# Patient Record
Sex: Female | Born: 1945 | Race: White | Hispanic: No | Smoking: Never smoker
Health system: Southern US, Community
[De-identification: ages and names within clinical notes are randomized; demographics above are authoritative.]

## PROBLEM LIST (undated history)

## (undated) DIAGNOSIS — Z9889 Other specified postprocedural states: Secondary | ICD-10-CM

## (undated) DIAGNOSIS — T8859XA Other complications of anesthesia, initial encounter: Secondary | ICD-10-CM

## (undated) DIAGNOSIS — E039 Hypothyroidism, unspecified: Secondary | ICD-10-CM

## (undated) DIAGNOSIS — M1990 Unspecified osteoarthritis, unspecified site: Secondary | ICD-10-CM

## (undated) DIAGNOSIS — J189 Pneumonia, unspecified organism: Secondary | ICD-10-CM

## (undated) DIAGNOSIS — R112 Nausea with vomiting, unspecified: Secondary | ICD-10-CM

## (undated) DIAGNOSIS — C801 Malignant (primary) neoplasm, unspecified: Secondary | ICD-10-CM

## (undated) DIAGNOSIS — I10 Essential (primary) hypertension: Secondary | ICD-10-CM

## (undated) DIAGNOSIS — R7303 Prediabetes: Secondary | ICD-10-CM

---

## 1998-11-06 ENCOUNTER — Ambulatory Visit (HOSPITAL_COMMUNITY): Admission: RE | Admit: 1998-11-06 | Discharge: 1998-11-06 | Payer: Self-pay

## 1999-10-22 ENCOUNTER — Ambulatory Visit (HOSPITAL_COMMUNITY): Admission: RE | Admit: 1999-10-22 | Discharge: 1999-10-22 | Payer: Self-pay

## 2000-10-26 ENCOUNTER — Encounter: Payer: Self-pay | Admitting: Family Medicine

## 2000-10-26 ENCOUNTER — Ambulatory Visit (HOSPITAL_COMMUNITY): Admission: RE | Admit: 2000-10-26 | Discharge: 2000-10-26 | Payer: Self-pay | Admitting: Family Medicine

## 2001-10-27 ENCOUNTER — Ambulatory Visit (HOSPITAL_COMMUNITY): Admission: RE | Admit: 2001-10-27 | Discharge: 2001-10-27 | Payer: Self-pay | Admitting: Family Medicine

## 2001-10-27 ENCOUNTER — Encounter: Payer: Self-pay | Admitting: Family Medicine

## 2004-09-15 ENCOUNTER — Encounter: Admission: RE | Admit: 2004-09-15 | Discharge: 2004-09-15 | Payer: Self-pay | Admitting: Specialist

## 2004-10-12 ENCOUNTER — Observation Stay (HOSPITAL_COMMUNITY): Admission: RE | Admit: 2004-10-12 | Discharge: 2004-10-13 | Payer: Self-pay | Admitting: Specialist

## 2006-03-12 IMAGING — RF DG MYELOGRAM LUMBAR
14 of 19 series · 14 of 19 positions shown · IV contrast (omnipaque)
Comparison: none

CLINICAL DATA: Patient has progressive back pain, progressive left lower extremity radicular pain, numbness and tingling.  
 LUMBAR MYELOGRAM 
 Following informed consent, a 22 gauge needle is placed into the thecal sac centrally at the L-3 level.  15 cc Omnipaque 180 contrast was injected.  The needle was withdrawn and multiple images were obtained.
 The patient has five non-rib bearing lumbar-type vertebrae.  Lateral bending views as well as flexion and extension views were obtained.  There is an extradural defect on the left at the L-3 level.  This is most pronounced in the AP and neutral position.  No appreciable change with lateral bending.  In the sagittal projection, there is grade I anterolisthesis of L-3 on L-4.  This measures 4 mm in the neutral position, approximately 3 mm with extension and approximately 4.5 with flexion.  The oblique view shows truncation of the left L-3 root sleeve.  The left L-4 and left L-5 roots fill normally.  On the right, there is underfilling of the right L-3 root.  The L4-5 and S-1 roots fill normally. 
 IMPRESSION
 Extradural defect seen on the left at the L-3 level.  CT to follow.
 CT OF THE LUMBAR SPINE POST-INTRATHECAL CONTRAST  
 L1-2 level is normal. 
 L2-3:  Disc bulge eccentric left.  There is soft tissue in the left subarticular lateral recess which I suspect is disc material.  This results in mild indentation upon the thecal sac.  The L-2 roots exit without encroachment.  
 L3-4:  There is a left paracentral disc herniation.  Disc extends cephalad resulting in mild indentation upon the left lateral thecal sac.  This disc material abuts the L-3 dorsal root ganglion and I suspect encroaches upon the extraforaminal root.  Associated facet degenerative changes are seen bilaterally.  
 L4-5:  Facet degenerative changes are seen bilaterally.  The L-4 roots exit without encroachment. 
 L5-S1:  Disc height loss with central vacuum disc.  The patient has bilateral pars interarticularis defects.  No appreciable central nor foraminal stenosis. 
 L3-4 disc herniation.  There is cephalad extent of disc material which results in indentation upon the left lateral aspect of the thecal sac and extends into the left L2-3 subarticular lateral recess.  Disc material also extends into the left foramen and I suspect encroaches upon the extraforaminal L-3 root.  Facet degenerative changes are seen at L3-4, L4-5, and at L5-S1.  
 Bilateral pars interarticularis defects are seen at L5-S1.
 MULTIPLANAR REFORMAT IMAGING OF THE LUMBAR SPINE 
 L3-4 disc extrusion is noted with disc material migrated cephalad, lying posterior to the L-3 vertebral body.  This is eccentric left.  Additionally, there is left foraminal disc material at the L3-4 level.  Accelerated degenerative disc disease is seen at L5-S1.  No appreciable foraminal encroachment. 
 See above report.

[Series 1: (hospital) · 1 of 1 slices shown]
[im 1/1]
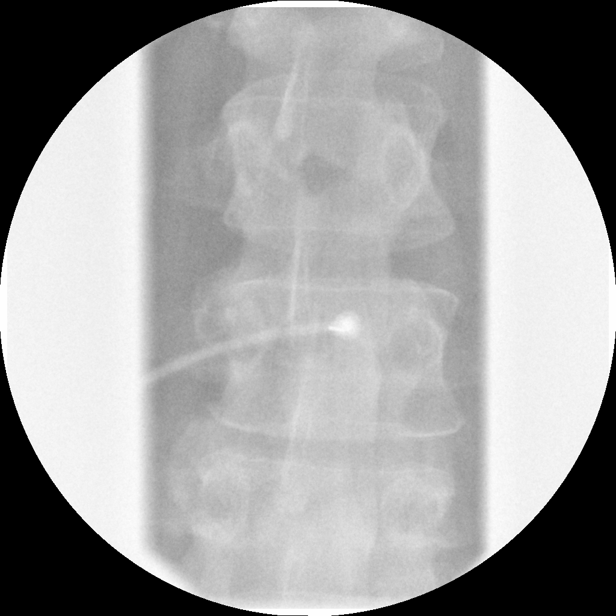

[Series 3: myelogram  white · 1 of 1 slices shown (1 of 9)]
[im 1/1]
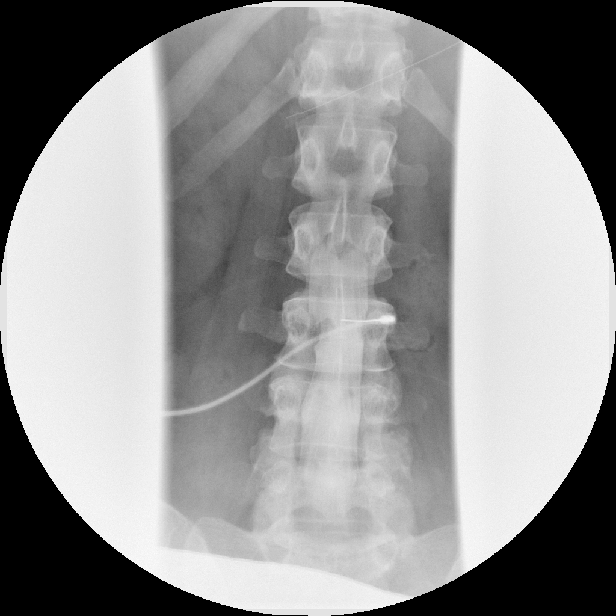

[Series 4: myelogram  white · 1 of 1 slices shown (2 of 9)]
[im 1/1]
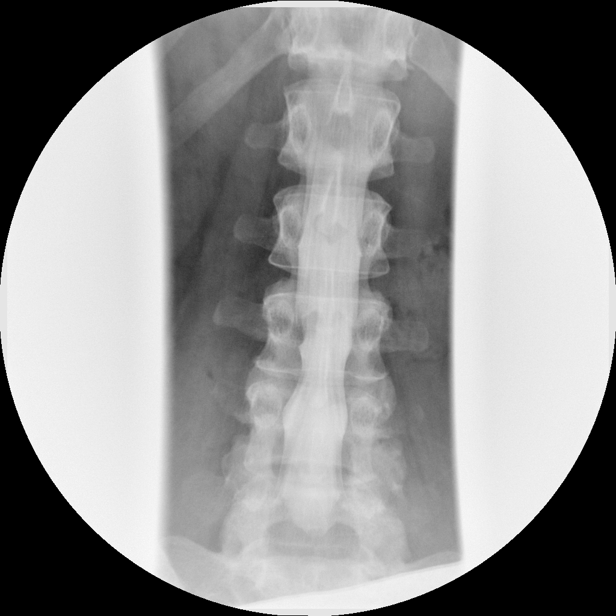

[Series 5: myelogram  white · 1 of 1 slices shown (3 of 9)]
[im 1/1]
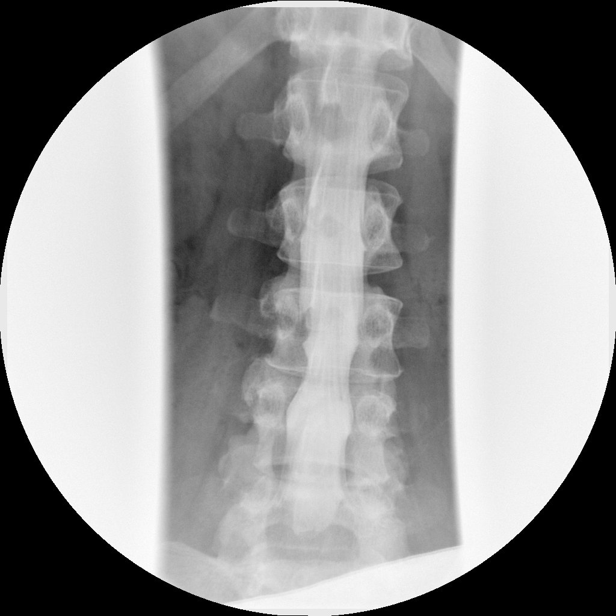

[Series 7: myelogram  white · 1 of 1 slices shown (4 of 9)]
[im 1/1]
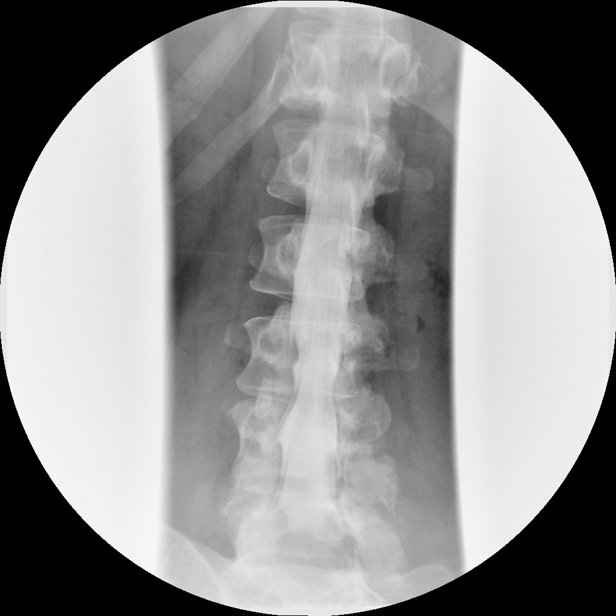

[Series 8: myelogram  white · 1 of 1 slices shown (5 of 9)]
[im 1/1]
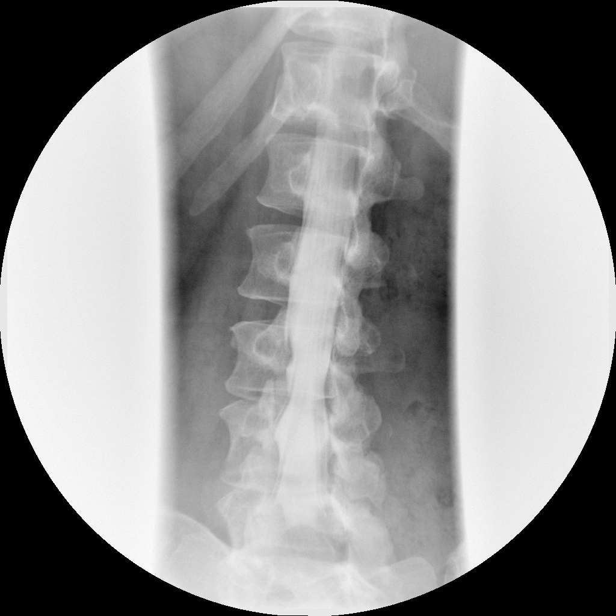

[Series 9: myelogram  white · 1 of 1 slices shown (6 of 9)]
[im 1/1]
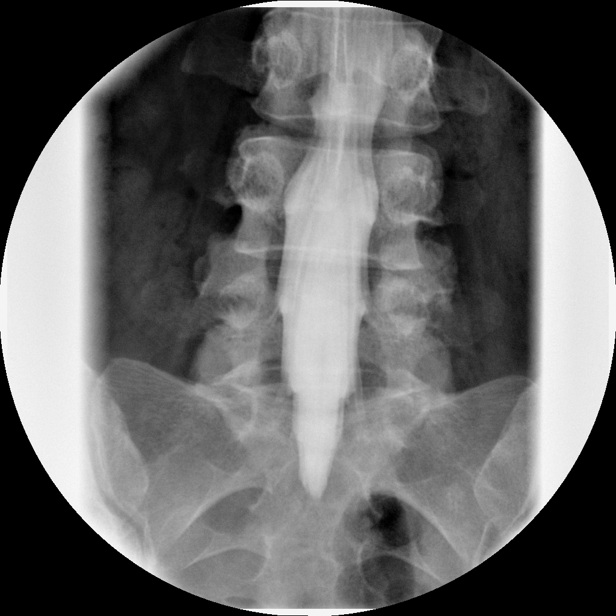

[Series 11: myelogram  white · 1 of 1 slices shown (7 of 9)]
[im 1/1]
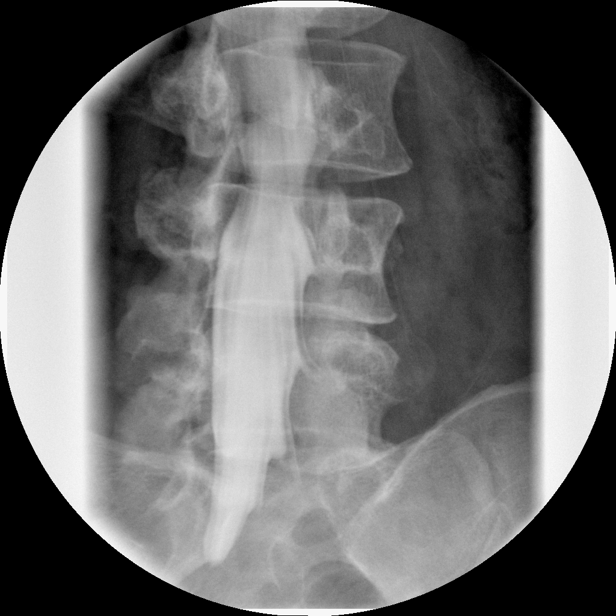

[Series 12: myelogram  white · 1 of 1 slices shown (8 of 9)]
[im 1/1]
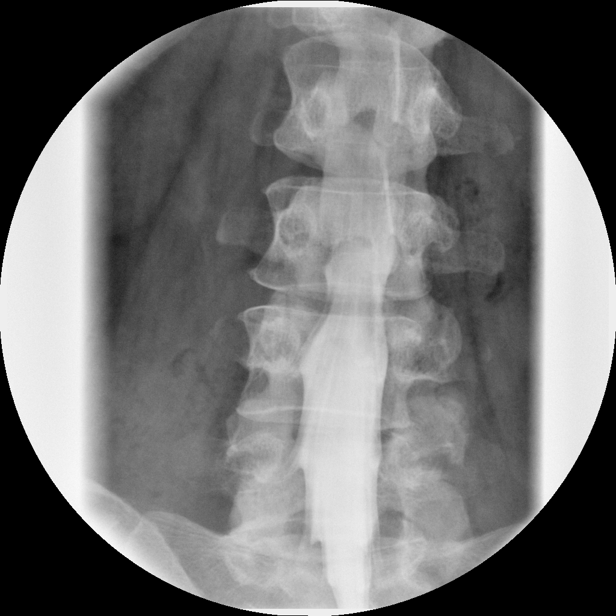

[Series 13: myelogram  white · 1 of 1 slices shown (9 of 9)]
[im 1/1]
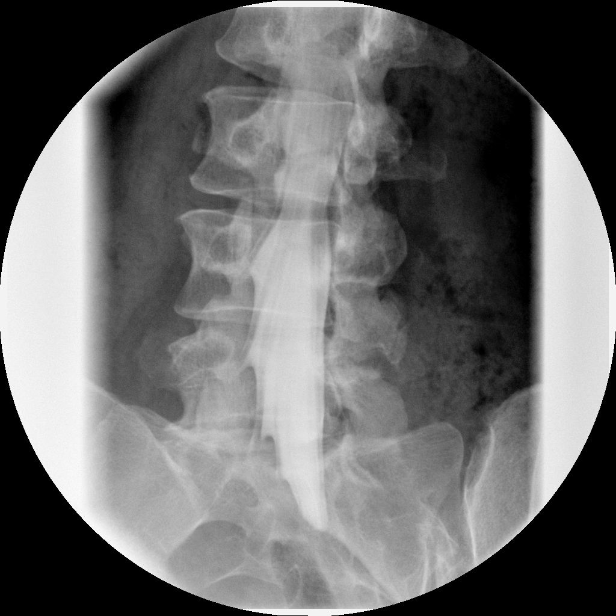

[Series 1001: view not recorded · 0.20mm/px · 1 of 1 slices shown (1 of 4)]
[im 1/1]
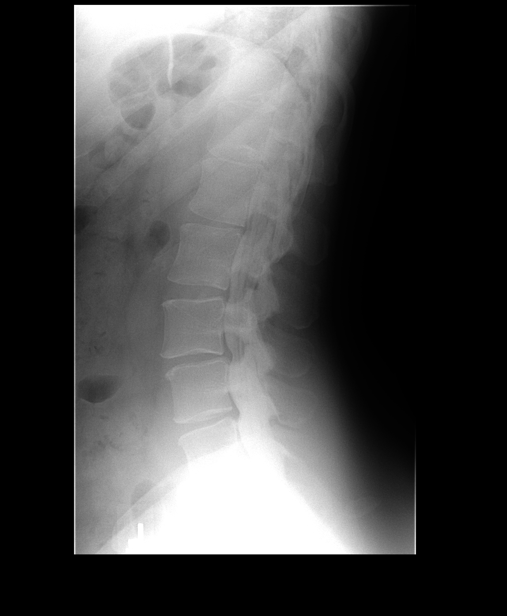

[Series 1002: view not recorded · 0.20mm/px · 1 of 1 slices shown (2 of 4)]
[im 1/1]
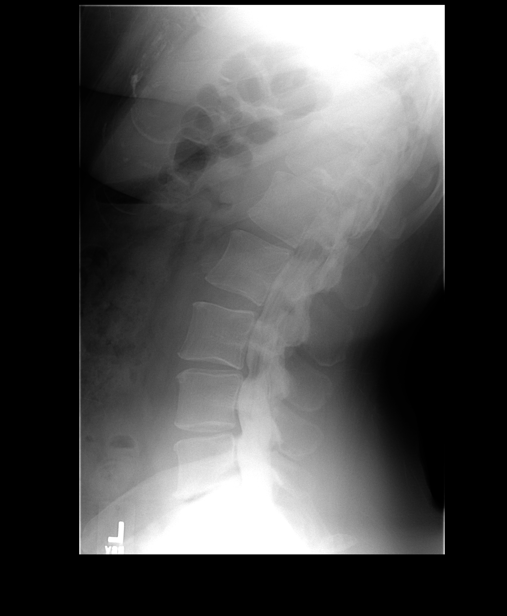

[Series 1003: view not recorded · 0.20mm/px · 1 of 1 slices shown (3 of 4)]
[im 1/1]
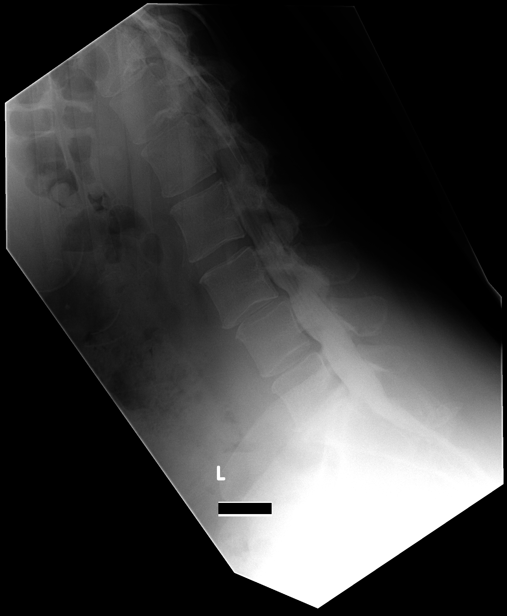

[Series 1005: view not recorded · 0.20mm/px · 1 of 1 slices shown (4 of 4)]
[im 1/1]
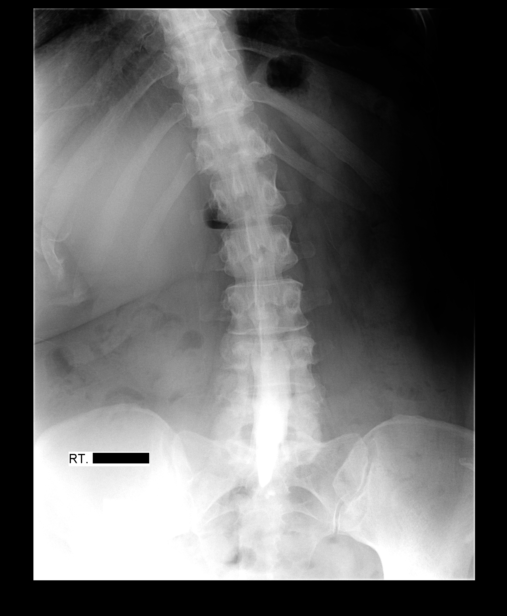

[14 of 19 positions shown; findings below may reference images not displayed]

## 2006-03-12 IMAGING — CT CT RECONSTRUCTION
3 of 10 series · 11 of 33 positions shown, 13 images · IV contrast (omnipaque)
Comparison: none

CLINICAL DATA: Patient has progressive back pain, progressive left lower extremity radicular pain, numbness and tingling.  
 LUMBAR MYELOGRAM 
 Following informed consent, a 22 gauge needle is placed into the thecal sac centrally at the L-3 level.  15 cc Omnipaque 180 contrast was injected.  The needle was withdrawn and multiple images were obtained.
 The patient has five non-rib bearing lumbar-type vertebrae.  Lateral bending views as well as flexion and extension views were obtained.  There is an extradural defect on the left at the L-3 level.  This is most pronounced in the AP and neutral position.  No appreciable change with lateral bending.  In the sagittal projection, there is grade I anterolisthesis of L-3 on L-4.  This measures 4 mm in the neutral position, approximately 3 mm with extension and approximately 4.5 with flexion.  The oblique view shows truncation of the left L-3 root sleeve.  The left L-4 and left L-5 roots fill normally.  On the right, there is underfilling of the right L-3 root.  The L4-5 and S-1 roots fill normally. 
 IMPRESSION
 Extradural defect seen on the left at the L-3 level.  CT to follow.
 CT OF THE LUMBAR SPINE POST-INTRATHECAL CONTRAST  
 L1-2 level is normal. 
 L2-3:  Disc bulge eccentric left.  There is soft tissue in the left subarticular lateral recess which I suspect is disc material.  This results in mild indentation upon the thecal sac.  The L-2 roots exit without encroachment.  
 L3-4:  There is a left paracentral disc herniation.  Disc extends cephalad resulting in mild indentation upon the left lateral thecal sac.  This disc material abuts the L-3 dorsal root ganglion and I suspect encroaches upon the extraforaminal root.  Associated facet degenerative changes are seen bilaterally.  
 L4-5:  Facet degenerative changes are seen bilaterally.  The L-4 roots exit without encroachment. 
 L5-S1:  Disc height loss with central vacuum disc.  The patient has bilateral pars interarticularis defects.  No appreciable central nor foraminal stenosis. 
 L3-4 disc herniation.  There is cephalad extent of disc material which results in indentation upon the left lateral aspect of the thecal sac and extends into the left L2-3 subarticular lateral recess.  Disc material also extends into the left foramen and I suspect encroaches upon the extraforaminal L-3 root.  Facet degenerative changes are seen at L3-4, L4-5, and at L5-S1.  
 Bilateral pars interarticularis defects are seen at L5-S1.
 MULTIPLANAR REFORMAT IMAGING OF THE LUMBAR SPINE 
 L3-4 disc extrusion is noted with disc material migrated cephalad, lying posterior to the L-3 vertebral body.  This is eccentric left.  Additionally, there is left foraminal disc material at the L3-4 level.  Accelerated degenerative disc disease is seen at L5-S1.  No appreciable foraminal encroachment. 
 See above report.

[Series 4: recon 3: l-spine helical · axial · 0.27mm/px · z∈[-7,+85]mm · 3 of 147 slices shown, 4 images]
[im 37/147  soft-tissue]
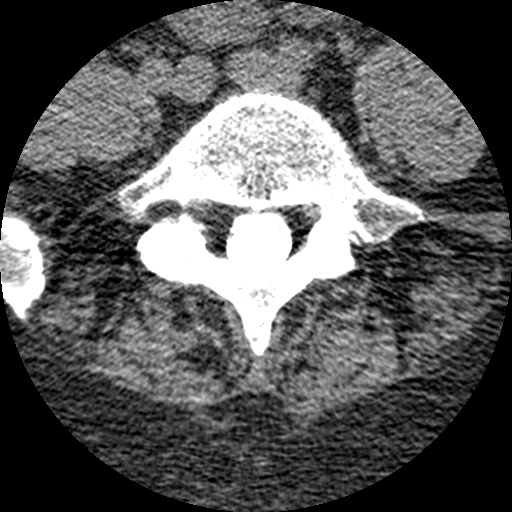
[im 37/147  bone]
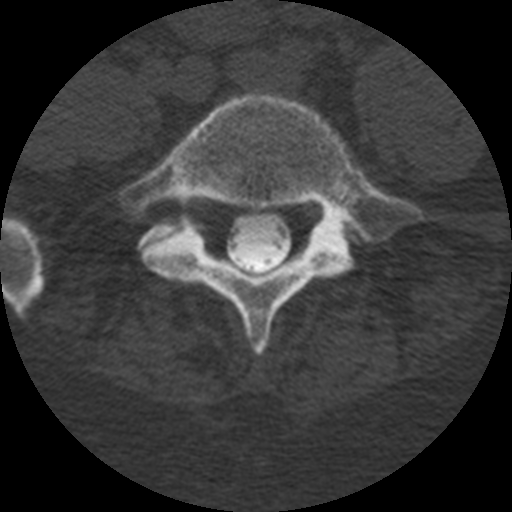
[im 74/147  bone]
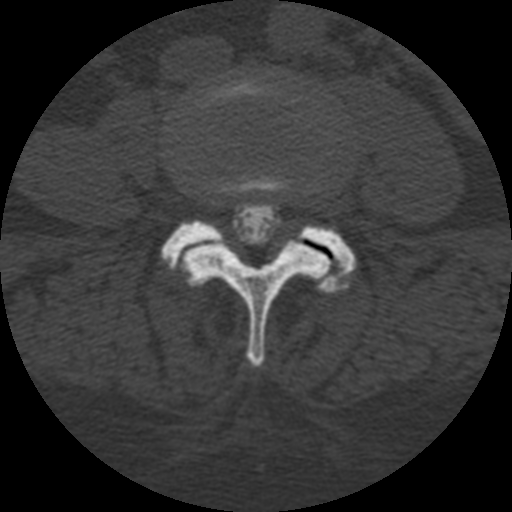
[im 110/147  bone]
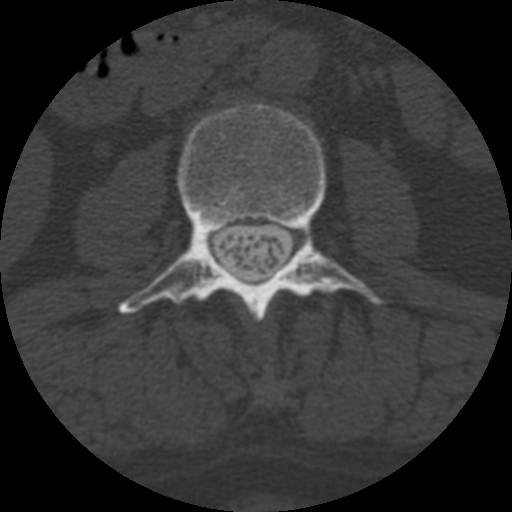

[Series 400: reformatted · sagittal · 0.37mm/px · 5 of 40 slices shown, 6 images (1 of 2)]
[im 14/40  bone]
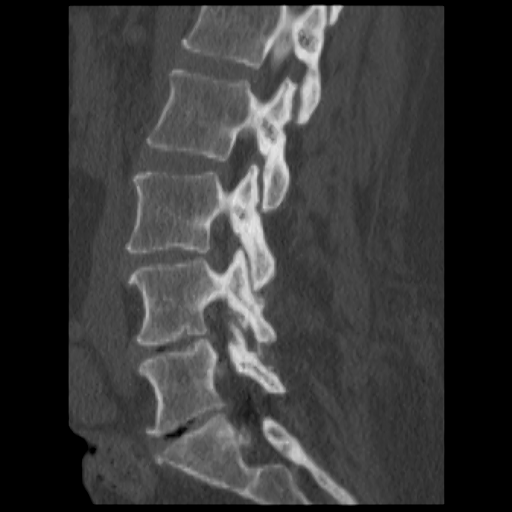
[im 17/40  bone]
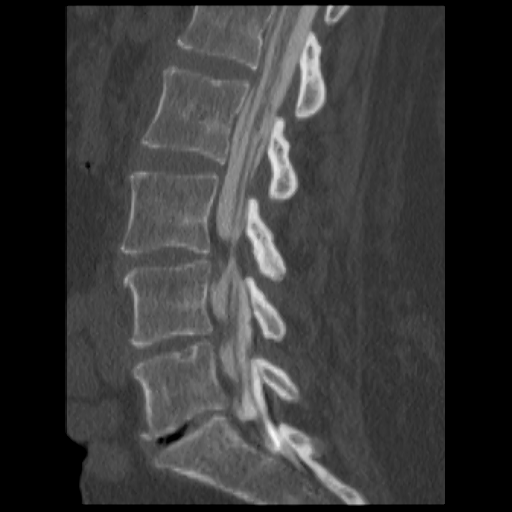
[im 20/40  soft-tissue]
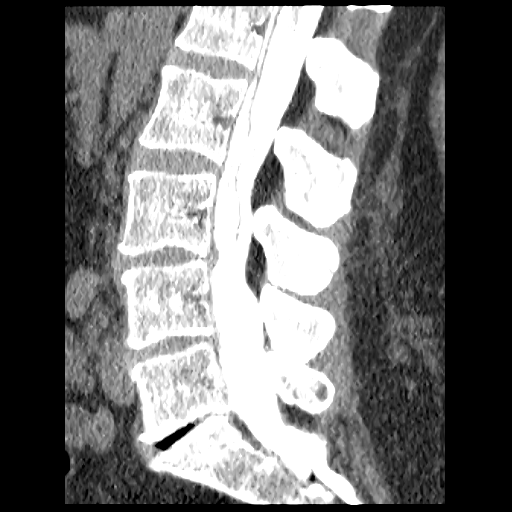
[im 20/40  bone]
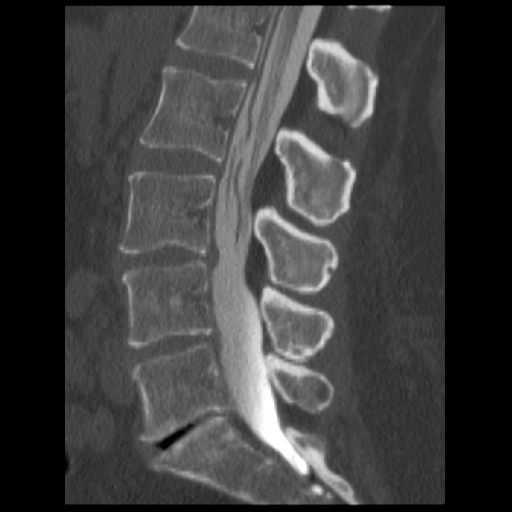
[im 23/40  bone]
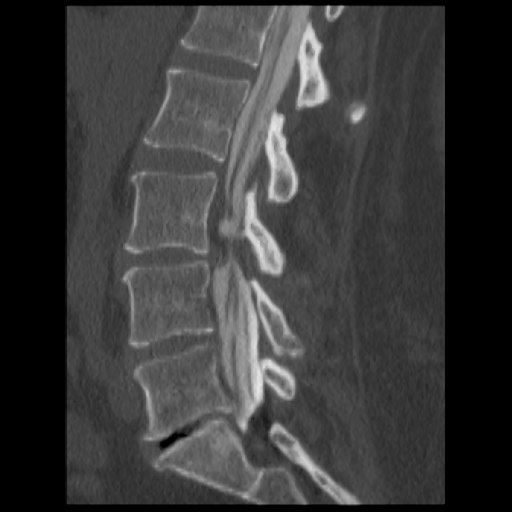
[im 27/40  bone]
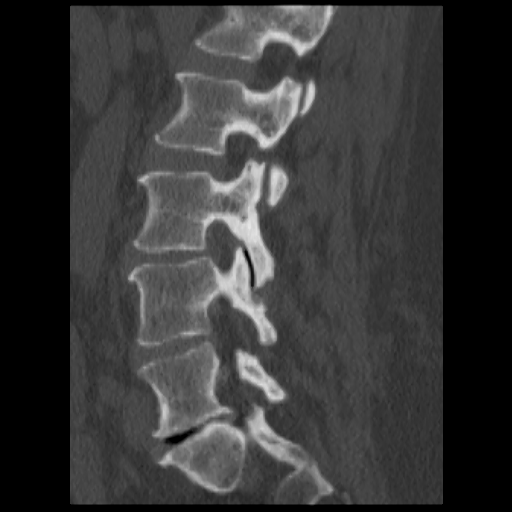

[Series 401: reformatted · coronal · 0.37mm/px · 3 of 40 slices shown (2 of 2)]
[im 8/40  bone]
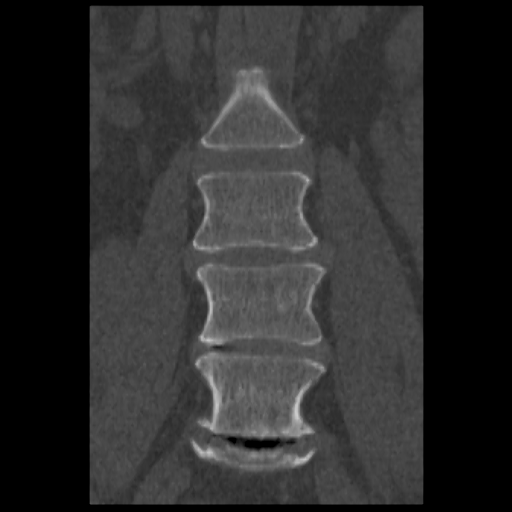
[im 16/40  bone]
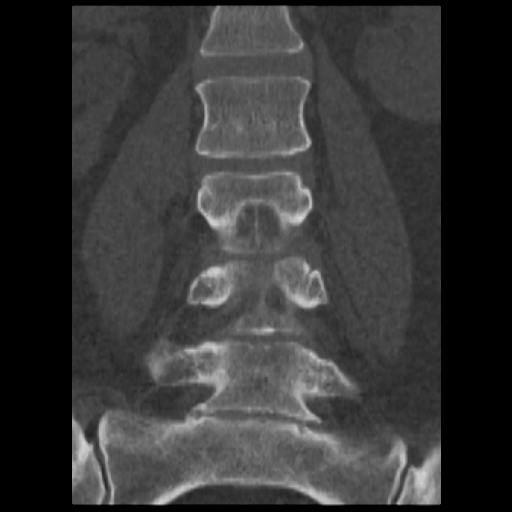
[im 24/40  bone]
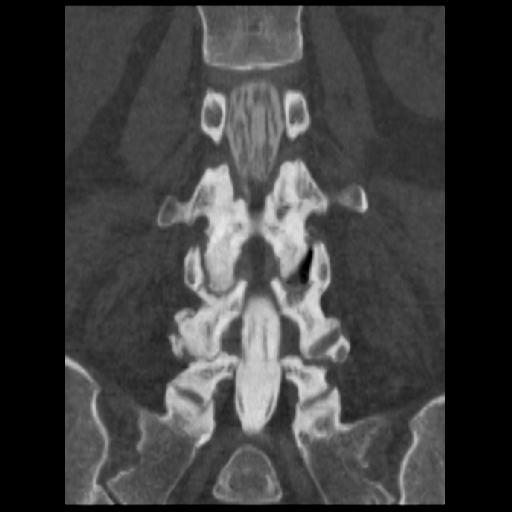

[11 of 33 positions shown; findings below may reference images not displayed]

## 2011-01-17 ENCOUNTER — Encounter: Payer: Self-pay | Admitting: Specialist

## 2020-08-12 ENCOUNTER — Ambulatory Visit: Payer: Self-pay | Admitting: Student

## 2020-08-14 NOTE — Patient Instructions (Addendum)
DUE TO COVID-19 ONLY ONE VISITOR IS ALLOWED TO COME WITH YOU AND STAY IN THE WAITING ROOM ONLY DURING PRE OP AND PROCEDURE DAY OF SURGERY. THE 1 VISITOR  MAY VISIT WITH YOU AFTER SURGERY IN YOUR PRIVATE ROOM DURING VISITING HOURS ONLY!  YOU NEED TO HAVE A COVID 19 TEST ON: 08/18/20  , THIS TEST MUST BE DONE BEFORE SURGERY,  COVID TESTING SITE [address] [place] [address], IT IS ON THE RIGHT GOING OUT [address] APPROXIMATELY  2 MINUTES PAST [employer] ON THE RIGHT. ONCE YOUR COVID TEST IS COMPLETED,  PLEASE BEGIN THE QUARANTINE INSTRUCTIONS AS OUTLINED IN YOUR HANDOUT.                Molly Cameron   Your procedure is scheduled on: 08/21/20   Report to [facility] Main  Entrance   Report to short stay at : 5:30 AM     Call this number if you have problems the morning of surgery [phone]    Remember:   NO SOLID FOOD AFTER MIDNIGHT THE NIGHT PRIOR TO SURGERY. NOTHING BY MOUTH EXCEPT CLEAR LIQUIDS UNTIL: 4:30 am . PLEASE FINISH GATORADE DRINK PER SURGEON ORDER  WHICH NEEDS TO BE COMPLETED AT: 4:30 am .  CLEAR LIQUID DIET   Foods Allowed                                                                     Foods Excluded  Coffee and tea, regular and decaf                             liquids that you cannot  Plain Jell-O any favor except red or purple                                           see through such as: Fruit ices (not with fruit pulp)                                     milk, soups, orange juice  Iced Popsicles                                    All solid food Carbonated beverages, regular and diet                                    Cranberry, grape and apple juices Sports drinks like Gatorade Lightly seasoned clear broth or consume(fat free) Sugar, honey syrup  Sample Menu Breakfast                                Lunch  Supper Cranberry juice                    Beef broth                             Chicken broth Jell-O                                     Grape juice                           Apple juice Coffee or tea                        Jell-O                                      Popsicle                                                Coffee or tea                        Coffee or tea  _____________________________________________________________________   BRUSH YOUR TEETH MORNING OF SURGERY AND RINSE YOUR MOUTH OUT, NO CHEWING GUM CANDY OR MINTS.     Take these medicines the morning of surgery with A SIP OF WATER: allopurinol,amlodipine,cetirizine,synthroid.  How to Manage Your Diabetes Before and After Surgery  Why is it important to control my blood sugar before and after surgery? . Improving blood sugar levels before and after surgery helps healing and can limit problems. . A way of improving blood sugar control is eating a healthy diet by: o  Eating less sugar and carbohydrates o  Increasing activity/exercise o  Talking with your doctor about reaching your blood sugar goals . High blood sugars (greater than 180 mg/dL) can raise your risk of infections and slow your recovery, so you will need to focus on controlling your diabetes during the weeks before surgery. . Make sure that the doctor who takes care of your diabetes knows about your planned surgery including the date and location.  How do I manage my blood sugar before surgery? . Check your blood sugar at least 4 times a day, starting 2 days before surgery, to make sure that the level is not too high or low. o Check your blood sugar the morning of your surgery when you wake up and every 2 hours until you get to the Short Stay unit. . If your blood sugar is less than 70 mg/dL, you will need to treat for low blood sugar: o Do not take insulin. o Treat a low blood sugar (less than 70 mg/dL) with  cup of clear juice (cranberry or apple), 4 glucose tablets, OR glucose gel. o Recheck blood sugar in 15 minutes after  treatment (to make sure it is greater than 70 mg/dL). If your blood sugar is not greater than 70 mg/dL on recheck, call [phone] for further instructions. . Report your blood sugar to the short stay nurse when you get to Short Stay.  . If you  are admitted to the hospital after surgery: o Your blood sugar will be checked by the staff and you will probably be given insulin after surgery (instead of oral diabetes medicines) to make sure you have good blood sugar levels. o The goal for blood sugar control after surgery is 80-180 mg/dL.   WHAT DO I DO ABOUT MY DIABETES MEDICATION?  Marland Kitchen Do not take oral diabetes medicines (pills) the morning of surgery.  . THE DAY BEFORE SURGERY, take Metformin as usual.       . THE MORNING OF SURGERY, DO NOT take Metformin.  DO NOT TAKE ANY DIABETIC MEDICATIONS DAY OF YOUR SURGERY                               You may not have any metal on your body including hair pins and              piercings  Do not wear jewelry, make-up, lotions, powders or perfumes, deodorant             Do not wear nail polish on your fingernails.  Do not shave  48 hours prior to surgery.                Do not bring valuables to the hospital. [place].  Contacts, dentures or bridgework may not be worn into surgery.  Leave suitcase in the car. After surgery it may be brought to your room.     Patients discharged the day of surgery will not be allowed to drive home. IF YOU ARE HAVING SURGERY AND GOING HOME THE SAME DAY, YOU MUST HAVE AN ADULT TO DRIVE YOU HOME AND BE WITH YOU FOR 24 HOURS. YOU MAY GO HOME BY TAXI OR UBER OR ORTHERWISE, BUT AN ADULT MUST ACCOMPANY YOU HOME AND STAY WITH YOU FOR 24 HOURS.  Name and phone number of your driver:  Special Instructions: N/A              Please read over the following fact sheets you were given: _____________________________________________________________________          [facility]  - Preparing for Surgery Before surgery, you can play an important role.  Because skin is not sterile, your skin needs to be as free of germs as possible.  You can reduce the number of germs on your skin by washing with CHG (chlorahexidine gluconate) soap before surgery.  CHG is an antiseptic cleaner which kills germs and bonds with the skin to continue killing germs even after washing. Please DO NOT use if you have an allergy to CHG or antibacterial soaps.  If your skin becomes reddened/irritated stop using the CHG and inform your nurse when you arrive at Short Stay. Do not shave (including legs and underarms) for at least 48 hours prior to the first CHG shower.  You may shave your face/neck. Please follow these instructions carefully:  1.  Shower with CHG Soap the night before surgery and the  morning of Surgery.  2.  If you choose to wash your hair, wash your hair first as usual with your  normal  shampoo.  3.  After you shampoo, rinse your hair and body thoroughly to remove the  shampoo.  4.  Use CHG as you would any other liquid soap.  You can apply chg directly  to the skin and wash                       Gently with a scrungie or clean washcloth.  5.  Apply the CHG Soap to your body ONLY FROM THE NECK DOWN.   Do not use on face/ open                           Wound or open sores. Avoid contact with eyes, ears mouth and genitals (private parts).                       Wash face,  Genitals (private parts) with your normal soap.             6.  Wash thoroughly, paying special attention to the area where your surgery  will be performed.  7.  Thoroughly rinse your body with warm water from the neck down.  8.  DO NOT shower/wash with your normal soap after using and rinsing off  the CHG Soap.                9.  Pat yourself dry with a clean towel.            10.  Wear clean pajamas.            11.  Place clean sheets on your bed the night of your first shower and do not  sleep  with pets. Day of Surgery : Do not apply any lotions/deodorants the morning of surgery.  Please wear clean clothes to the hospital/surgery center.  FAILURE TO FOLLOW THESE INSTRUCTIONS MAY RESULT IN THE CANCELLATION OF YOUR SURGERY PATIENT SIGNATURE_________________________________  NURSE SIGNATURE__________________________________  ________________________________________________________________________   Adam Phenix  An incentive spirometer is a tool that can help keep your lungs clear and active. This tool measures how well you are filling your lungs with each breath. Taking long deep breaths may help reverse or decrease the chance of developing breathing (pulmonary) problems (especially infection) following:  A long period of time when you are unable to move or be active. BEFORE THE PROCEDURE   If the spirometer includes an indicator to show your best effort, your nurse or respiratory therapist will set it to a desired goal.  If possible, sit up straight or lean slightly forward. Try not to slouch.  Hold the incentive spirometer in an upright position. INSTRUCTIONS FOR USE  1. Sit on the edge of your bed if possible, or sit up as far as you can in bed or on a chair. 2. Hold the incentive spirometer in an upright position. 3. Breathe out normally. 4. Place the mouthpiece in your mouth and seal your lips tightly around it. 5. Breathe in slowly and as deeply as possible, raising the piston or the ball toward the top of the column. 6. Hold your breath for 3-5 seconds or for as long as possible. Allow the piston or ball to fall to the bottom of the column. 7. Remove the mouthpiece from your mouth and breathe out normally. 8. Rest for a few seconds and repeat Steps 1 through 7 at least 10 times every 1-2 hours when you are awake. Take your time and take a few normal breaths between deep breaths. 9. The spirometer may include an indicator to show  your best effort. Use the  indicator as a goal to work toward during each repetition. 10. After each set of 10 deep breaths, practice coughing to be sure your lungs are clear. If you have an incision (the cut made at the time of surgery), support your incision when coughing by placing a pillow or rolled up towels firmly against it. Once you are able to get out of bed, walk around indoors and cough well. You may stop using the incentive spirometer when instructed by your caregiver.  RISKS AND COMPLICATIONS  Take your time so you do not get dizzy or light-headed.  If you are in pain, you may need to take or ask for pain medication before doing incentive spirometry. It is harder to take a deep breath if you are having pain. AFTER USE  Rest and breathe slowly and easily.  It can be helpful to keep track of a log of your progress. Your caregiver can provide you with a simple table to help with this. If you are using the spirometer at home, follow these instructions: [place] IF:   You are having difficultly using the spirometer.  You have trouble using the spirometer as often as instructed.  Your pain medication is not giving enough relief while using the spirometer.  You develop fever of 100.5 F (38.1 C) or higher. SEEK IMMEDIATE MEDICAL CARE IF:   You cough up bloody sputum that had not been present before.  You develop fever of 102 F (38.9 C) or greater.  You develop worsening pain at or near the incision site. MAKE SURE YOU:   Understand these instructions.  Will watch your condition.  Will get help right away if you are not doing well or get worse. Document Released: 04/25/2007 Document Revised: 03/06/2012 Document Reviewed: 06/26/2007 [facility] Patient Information 2014 [place], [place].   ________________________________________________________________________

## 2020-08-15 ENCOUNTER — Ambulatory Visit: Payer: Self-pay | Admitting: Student

## 2020-08-15 NOTE — H&P (Signed)
TOTAL KNEE ADMISSION H&P  Patient is being admitted for right total knee arthroplasty.  Subjective:  Chief Complaint:right knee pain.  HPI: Molly Cameron, 74 y.o. female, has a history of pain and functional disability in the right knee due to arthritis and has failed non-surgical conservative treatments for greater than 12 weeks to includeNSAID's and/or analgesics, corticosteriod injections and viscosupplementation injections.  Onset of symptoms was gradual, starting 7 years ago with gradually worsening course since that time. The patient noted no past surgery on the right knee(s).  Patient currently rates pain in the right knee(s) at 8 out of 10 with activity. Patient has worsening of pain with activity and weight bearing, pain that interferes with activities of daily living and pain with passive range of motion.  Patient has evidence of subchondral cysts and joint space narrowing by imaging studies. There is no active infection.  There are no problems to display for this patient.   Past Medical History:  Diagnosis Date  . Arthritis   . Cancer (HCC)    skin bil. face,arms  . Complication of anesthesia   . Hypertension   . Hypothyroidism   . Pneumonia   . PONV (postoperative nausea and vomiting)   . Pre-diabetes     Past Surgical History:  Procedure Laterality Date  . ABDOMINAL HYSTERECTOMY     partial  . LAMINECTOMY    . TONSILLECTOMY      No current facility-administered medications for this visit.   No current outpatient medications on file.   Facility-Administered Medications Ordered in Other Visits  Medication Dose Route Frequency Provider Last Rate Last Admin  . 0.9 %  sodium chloride infusion   Intravenous Continuous Cherlynn June B, PA      . acetaminophen (OFIRMEV) IV 1,000 mg  1,000 mg Intravenous Once Aydien Majette B, [place]      . ceFAZolin (ANCEF) IVPB 2g/100 mL premix  2 g Intravenous On Call to [place], [place], PA      . lactated ringers infusion    Intravenous Continuous Duane Boston, MD      . lactated ringers infusion   Intravenous Continuous Rod Can, MD 10 mL/hr at 08/21/20 9563 Continued from Pre-op at 08/21/20 0658  . tranexamic acid (CYKLOKAPRON) IVPB 1,000 mg  1,000 mg Intravenous To OR Cherlynn June B, PA       Allergies  Allergen Reactions  . Codeine Itching  . Quinaminoph [Quinine]     Causes migraines    Social History   Tobacco Use  . Smoking status: Never Smoker  . Smokeless tobacco: Never Used  Substance Use Topics  . Alcohol use: Yes    Comment: ocas.    No family history on file.   Review of Systems  Constitutional: Negative.   HENT: Negative.   Eyes: Negative.   Respiratory: Negative.   Cardiovascular: Negative.   Gastrointestinal: Negative.   Endocrine: Negative.   Genitourinary: Negative.   Musculoskeletal: Negative.   Skin: Negative.   Allergic/Immunologic: Negative.   Neurological: Negative.   Hematological: Negative.   Psychiatric/Behavioral: Negative.     Objective:  Physical Exam Constitutional:      Appearance: Normal appearance.  HENT:     Head: Normocephalic and atraumatic.     Right Ear: External ear normal.     Left Ear: External ear normal.  Cardiovascular:     Rate and Rhythm: Normal rate and regular rhythm.     Heart sounds: Normal heart sounds.  Pulmonary:  Effort: Pulmonary effort is normal.     Breath sounds: Normal breath sounds.  Abdominal:     Palpations: Abdomen is soft.     Tenderness: There is no abdominal tenderness.  Genitourinary:    Comments: Deferred Musculoskeletal:     Cervical back: Normal range of motion.     Comments: Examination of the right knee reveals no skin wounds or lesions. She does have swelling. Trace effusion. No warmth or erythema. Varus deformity. Range of motion 0-105. She has varus/valgus pseudolaxity but no instability. Painless range motion of the hip.  Skin:    General: Skin is warm and dry.  Neurological:      Mental Status: She is alert and oriented to person, place, and time.  Psychiatric:        Mood and Affect: Mood normal.     Vital signs in last 24 hours: @VSRANGES @  Labs:   Estimated body mass index is 38.77 kg/m as calculated from the following:   Height as of 08/21/20: 5\' 5"  (1.651 m).   Weight as of 08/21/20: 105.7 kg.   Imaging Review Plain radiographs demonstrate severe degenerative joint disease of the right knee(s). The overall alignment ismild varus. The bone quality appears to be adequate for age and reported activity level.      Assessment/Plan:  End stage arthritis, right knee   The patient history, physical examination, clinical judgment of the provider and imaging studies are consistent with end stage degenerative joint disease of the right knee(s) and total knee arthroplasty is deemed medically necessary. The treatment options including medical management, injection therapy arthroscopy and arthroplasty were discussed at length. The risks and benefits of total knee arthroplasty were presented and reviewed. The risks due to aseptic loosening, infection, stiffness, patella tracking problems, thromboembolic complications and other imponderables were discussed. The patient acknowledged the explanation, agreed to proceed with the plan and consent was signed. Patient is being admitted for inpatient treatment for surgery, pain control, PT, OT, prophylactic antibiotics, VTE prophylaxis, progressive ambulation and ADL's and discharge planning. The patient is planning to be discharged  home on the same day with outpatient physical therapy.     Patient's anticipated LOS is less than 2 midnights, meeting these requirements: - Lives within 1 hour of care - Has a competent adult at home to recover with post-op recover - NO history of  - Chronic pain requiring opiods  - Diabetes  - Coronary Artery Disease  - Heart failure  - Heart attack  - Stroke  - DVT/VTE  - Cardiac  arrhythmia  - Respiratory Failure/COPD  - Renal failure  - Anemia  - Advanced Liver disease

## 2020-08-15 NOTE — H&P (View-Only) (Signed)
TOTAL KNEE ADMISSION H&P  Patient is being admitted for right total knee arthroplasty.  Subjective:  Chief Complaint:right knee pain.  HPI: Molly Cameron, 74 y.o. female, has a history of pain and functional disability in the right knee due to arthritis and has failed non-surgical conservative treatments for greater than 12 weeks to includeNSAID's and/or analgesics, corticosteriod injections and viscosupplementation injections.  Onset of symptoms was gradual, starting 7 years ago with gradually worsening course since that time. The patient noted no past surgery on the right knee(s).  Patient currently rates pain in the right knee(s) at 8 out of 10 with activity. Patient has worsening of pain with activity and weight bearing, pain that interferes with activities of daily living and pain with passive range of motion.  Patient has evidence of subchondral cysts and joint space narrowing by imaging studies. There is no active infection.  There are no problems to display for this patient.   Past Medical History:  Diagnosis Date  . Arthritis   . Cancer (HCC)    skin bil. face,arms  . Complication of anesthesia   . Hypertension   . Hypothyroidism   . Pneumonia   . PONV (postoperative nausea and vomiting)   . Pre-diabetes     Past Surgical History:  Procedure Laterality Date  . ABDOMINAL HYSTERECTOMY     partial  . LAMINECTOMY    . TONSILLECTOMY      No current facility-administered medications for this visit.   No current outpatient medications on file.   Facility-Administered Medications Ordered in Other Visits  Medication Dose Route Frequency Provider Last Rate Last Admin  . 0.9 %  sodium chloride infusion   Intravenous Continuous Cherlynn June B, PA      . acetaminophen (OFIRMEV) IV 1,000 mg  1,000 mg Intravenous Once Joren Rehm B, [place]      . ceFAZolin (ANCEF) IVPB 2g/100 mL premix  2 g Intravenous On Call to [place], [place], PA      . lactated ringers infusion    Intravenous Continuous Duane Boston, MD      . lactated ringers infusion   Intravenous Continuous Rod Can, MD 10 mL/hr at 08/21/20 3267 Continued from Pre-op at 08/21/20 0658  . tranexamic acid (CYKLOKAPRON) IVPB 1,000 mg  1,000 mg Intravenous To OR Cherlynn June B, PA       Allergies  Allergen Reactions  . Codeine Itching  . Quinaminoph [Quinine]     Causes migraines    Social History   Tobacco Use  . Smoking status: Never Smoker  . Smokeless tobacco: Never Used  Substance Use Topics  . Alcohol use: Yes    Comment: ocas.    No family history on file.   Review of Systems  Constitutional: Negative.   HENT: Negative.   Eyes: Negative.   Respiratory: Negative.   Cardiovascular: Negative.   Gastrointestinal: Negative.   Endocrine: Negative.   Genitourinary: Negative.   Musculoskeletal: Negative.   Skin: Negative.   Allergic/Immunologic: Negative.   Neurological: Negative.   Hematological: Negative.   Psychiatric/Behavioral: Negative.     Objective:  Physical Exam Constitutional:      Appearance: Normal appearance.  HENT:     Head: Normocephalic and atraumatic.     Right Ear: External ear normal.     Left Ear: External ear normal.  Cardiovascular:     Rate and Rhythm: Normal rate and regular rhythm.     Heart sounds: Normal heart sounds.  Pulmonary:  Effort: Pulmonary effort is normal.     Breath sounds: Normal breath sounds.  Abdominal:     Palpations: Abdomen is soft.     Tenderness: There is no abdominal tenderness.  Genitourinary:    Comments: Deferred Musculoskeletal:     Cervical back: Normal range of motion.     Comments: Examination of the right knee reveals no skin wounds or lesions. She does have swelling. Trace effusion. No warmth or erythema. Varus deformity. Range of motion 0-105. She has varus/valgus pseudolaxity but no instability. Painless range motion of the hip.  Skin:    General: Skin is warm and dry.  Neurological:      Mental Status: She is alert and oriented to person, place, and time.  Psychiatric:        Mood and Affect: Mood normal.     Vital signs in last 24 hours: @VSRANGES @  Labs:   Estimated body mass index is 38.77 kg/m as calculated from the following:   Height as of 08/21/20: 5\' 5"  (1.651 m).   Weight as of 08/21/20: 105.7 kg.   Imaging Review Plain radiographs demonstrate severe degenerative joint disease of the right knee(s). The overall alignment ismild varus. The bone quality appears to be adequate for age and reported activity level.      Assessment/Plan:  End stage arthritis, right knee   The patient history, physical examination, clinical judgment of the provider and imaging studies are consistent with end stage degenerative joint disease of the right knee(s) and total knee arthroplasty is deemed medically necessary. The treatment options including medical management, injection therapy arthroscopy and arthroplasty were discussed at length. The risks and benefits of total knee arthroplasty were presented and reviewed. The risks due to aseptic loosening, infection, stiffness, patella tracking problems, thromboembolic complications and other imponderables were discussed. The patient acknowledged the explanation, agreed to proceed with the plan and consent was signed. Patient is being admitted for inpatient treatment for surgery, pain control, PT, OT, prophylactic antibiotics, VTE prophylaxis, progressive ambulation and ADL's and discharge planning. The patient is planning to be discharged  home on the same day with outpatient physical therapy.     Patient's anticipated LOS is less than 2 midnights, meeting these requirements: - Lives within 1 hour of care - Has a competent adult at home to recover with post-op recover - NO history of  - Chronic pain requiring opiods  - Diabetes  - Coronary Artery Disease  - Heart failure  - Heart attack  - Stroke  - DVT/VTE  - Cardiac  arrhythmia  - Respiratory Failure/COPD  - Renal failure  - Anemia  - Advanced Liver disease

## 2020-08-18 ENCOUNTER — Encounter (HOSPITAL_COMMUNITY)
Admission: RE | Admit: 2020-08-18 | Discharge: 2020-08-18 | Disposition: A | Discharge status: Home / Self Care | Payer: Medicare Other | Source: Ambulatory Visit | Attending: Orthopedic Surgery | Admitting: Orthopedic Surgery

## 2020-08-18 ENCOUNTER — Other Ambulatory Visit (HOSPITAL_COMMUNITY)
Admission: RE | Admit: 2020-08-18 | Discharge: 2020-08-18 | Disposition: A | Discharge status: Home / Self Care | Payer: Medicare Other | Source: Ambulatory Visit | Attending: Orthopedic Surgery | Admitting: Orthopedic Surgery

## 2020-08-18 ENCOUNTER — Other Ambulatory Visit: Payer: Self-pay

## 2020-08-18 ENCOUNTER — Encounter (HOSPITAL_COMMUNITY): Payer: Self-pay

## 2020-08-18 DIAGNOSIS — Z20822 Contact with and (suspected) exposure to covid-19: Secondary | ICD-10-CM | POA: Insufficient documentation

## 2020-08-18 DIAGNOSIS — Z01818 Encounter for other preprocedural examination: Secondary | ICD-10-CM | POA: Diagnosis present

## 2020-08-18 DIAGNOSIS — Z01812 Encounter for preprocedural laboratory examination: Secondary | ICD-10-CM | POA: Insufficient documentation

## 2020-08-18 HISTORY — DX: Other specified postprocedural states: Z98.890

## 2020-08-18 HISTORY — DX: Prediabetes: R73.03

## 2020-08-18 HISTORY — DX: Malignant (primary) neoplasm, unspecified: C80.1

## 2020-08-18 HISTORY — DX: Hypothyroidism, unspecified: E03.9

## 2020-08-18 HISTORY — DX: Unspecified osteoarthritis, unspecified site: M19.90

## 2020-08-18 HISTORY — DX: Essential (primary) hypertension: I10

## 2020-08-18 HISTORY — DX: Other complications of anesthesia, initial encounter: T88.59XA

## 2020-08-18 HISTORY — DX: Other specified postprocedural states: R11.2

## 2020-08-18 HISTORY — DX: Pneumonia, unspecified organism: J18.9

## 2020-08-18 LAB — CBC
HCT: 39.2 % (ref 36.0–46.0)
Hemoglobin: 13 g/dL (ref 12.0–15.0)
MCH: 32 pg (ref 26.0–34.0)
MCHC: 33.2 g/dL (ref 30.0–36.0)
MCV: 96.6 fL (ref 80.0–100.0)
Platelets: 212 10*3/uL (ref 150–400)
RBC: 4.06 MIL/uL (ref 3.87–5.11)
RDW: 13.3 % (ref 11.5–15.5)
WBC: 6.6 10*3/uL (ref 4.0–10.5)
nRBC: 0 % (ref 0.0–0.2)

## 2020-08-18 LAB — SURGICAL PCR SCREEN
MRSA, PCR: NEGATIVE
Staphylococcus aureus: NEGATIVE

## 2020-08-18 LAB — COMPREHENSIVE METABOLIC PANEL
ALT: 15 U/L (ref 0–44)
AST: 18 U/L (ref 15–41)
Albumin: 4.5 g/dL (ref 3.5–5.0)
Alkaline Phosphatase: 99 U/L (ref 38–126)
Anion gap: 13 (ref 5–15)
BUN: 32 mg/dL — ABNORMAL HIGH (ref 8–23)
CO2: 22 mmol/L (ref 22–32)
Calcium: 9.7 mg/dL (ref 8.9–10.3)
Chloride: 107 mmol/L (ref 98–111)
Creatinine, Ser: 1.21 mg/dL — ABNORMAL HIGH (ref 0.44–1.00)
GFR calc Af Amer: 52 mL/min — ABNORMAL LOW (ref >60)
GFR calc non Af Amer: 45 mL/min — ABNORMAL LOW (ref >60)
Glucose, Bld: 105 mg/dL — ABNORMAL HIGH (ref 70–99)
Potassium: 4.1 mmol/L (ref 3.5–5.1)
Sodium: 142 mmol/L (ref 135–145)
Total Bilirubin: 1.5 mg/dL — ABNORMAL HIGH (ref 0.3–1.2)
Total Protein: 7.3 g/dL (ref 6.5–8.1)

## 2020-08-18 LAB — URINALYSIS, ROUTINE W REFLEX MICROSCOPIC
Bilirubin Urine: NEGATIVE
Glucose, UA: NEGATIVE mg/dL
Hgb urine dipstick: NEGATIVE
Ketones, ur: 5 mg/dL — AB
Leukocytes,Ua: NEGATIVE
Nitrite: NEGATIVE
Protein, ur: NEGATIVE mg/dL
Specific Gravity, Urine: 1.021 (ref 1.005–1.030)
pH: 5 (ref 5.0–8.0)

## 2020-08-18 LAB — PROTIME-INR
INR: 1 (ref 0.8–1.2)
Prothrombin Time: 12.8 seconds (ref 11.4–15.2)

## 2020-08-18 LAB — SARS CORONAVIRUS 2 (TAT 6-24 HRS): SARS Coronavirus 2: NEGATIVE

## 2020-08-18 NOTE — Progress Notes (Signed)
COVID Vaccine Completed: Yes Date COVID Vaccine completed: 06/13/20 COVID vaccine manufacturer: Pfizer    *Moderna   [employer]   PCP - Dr. Susa Day Cardiologist -   Chest x-ray -  EKG - Requested from [facility] Stress Test -  ECHO -  Cardiac Cath -   Sleep Study -  CPAP -   Fasting Blood Sugar - 100's Checks Blood Sugar __2___ times a week.  Blood Thinner Instructions: Aspirin Instructions: Last Dose:  Anesthesia review:   Patient denies shortness of breath, fever, cough and chest pain at PAT appointment   Patient verbalized understanding of instructions that were given to them at the PAT appointment. Patient was also instructed that they will need to review over the PAT instructions again at home before surgery.

## 2020-08-20 ENCOUNTER — Encounter (HOSPITAL_COMMUNITY): Payer: Self-pay | Admitting: Orthopedic Surgery

## 2020-08-20 NOTE — Anesthesia Preprocedure Evaluation (Addendum)
Anesthesia Evaluation  Patient identified by MRN, date of birth, ID band  Reviewed: Allergy & Precautions, NPO status , Patient's Chart, lab work & pertinent test results  History of Anesthesia Complications (+) PONV, PROLONGED EMERGENCE and history of anesthetic complications  Airway Mallampati: II  TM Distance: >3 FB Neck ROM: Full    Dental no notable dental hx.    Pulmonary sleep apnea (suggestive features, no formal diagnosis) ,    Pulmonary exam normal breath sounds clear to auscultation       Cardiovascular hypertension,  Rhythm:Regular Rate:Normal  METS 4   Neuro/Psych negative neurological ROS  negative psych ROS   GI/Hepatic negative GI ROS, Neg liver ROS,   Endo/Other  Hypothyroidism Morbid obesityPre-diabetes  Renal/GU   negative genitourinary   Musculoskeletal  (+) Arthritis , Osteoarthritis,    Abdominal   Peds  Hematology   Anesthesia Other Findings   Reproductive/Obstetrics                            Anesthesia Physical Anesthesia Plan  ASA: III  Anesthesia Plan: Regional and Spinal   Post-op Pain Management:  Regional for Post-op pain   Induction:   PONV Risk Score and Plan: 3 and Propofol infusion, Ondansetron and Dexamethasone  Airway Management Planned: Natural Airway and Simple Face Mask  Additional Equipment:   Intra-op Plan:   Post-operative Plan:   Informed Consent: I have reviewed the patients History and Physical, chart, labs and discussed the procedure including the risks, benefits and alternatives for the proposed anesthesia with the patient or authorized representative who has indicated his/her understanding and acceptance.       Plan Discussed with:   Anesthesia Plan Comments: (History of L2-5 laminectomy. Plan for spinal. Backup with GETA. )        Anesthesia Quick Evaluation

## 2020-08-21 ENCOUNTER — Encounter (HOSPITAL_COMMUNITY): Payer: Self-pay | Admitting: Orthopedic Surgery

## 2020-08-21 ENCOUNTER — Ambulatory Visit (HOSPITAL_COMMUNITY): Payer: Medicare Other

## 2020-08-21 ENCOUNTER — Ambulatory Visit (HOSPITAL_COMMUNITY): Payer: Medicare Other | Admitting: Anesthesiology

## 2020-08-21 ENCOUNTER — Ambulatory Visit (HOSPITAL_COMMUNITY)
Admission: RE | Admit: 2020-08-21 | Discharge: 2020-08-21 | Disposition: A | Discharge status: Home / Self Care | Payer: Medicare Other | Source: Ambulatory Visit | Attending: Orthopedic Surgery | Admitting: Orthopedic Surgery

## 2020-08-21 ENCOUNTER — Encounter (HOSPITAL_COMMUNITY)
Admission: RE | Disposition: A | Discharge status: Home / Self Care | Payer: Self-pay | Source: Ambulatory Visit | Attending: Orthopedic Surgery

## 2020-08-21 DIAGNOSIS — E039 Hypothyroidism, unspecified: Secondary | ICD-10-CM | POA: Diagnosis not present

## 2020-08-21 DIAGNOSIS — Z6838 Body mass index (BMI) 38.0-38.9, adult: Secondary | ICD-10-CM | POA: Insufficient documentation

## 2020-08-21 DIAGNOSIS — Z885 Allergy status to narcotic agent status: Secondary | ICD-10-CM | POA: Diagnosis not present

## 2020-08-21 DIAGNOSIS — R7303 Prediabetes: Secondary | ICD-10-CM | POA: Insufficient documentation

## 2020-08-21 DIAGNOSIS — I10 Essential (primary) hypertension: Secondary | ICD-10-CM | POA: Insufficient documentation

## 2020-08-21 DIAGNOSIS — M1711 Unilateral primary osteoarthritis, right knee: Secondary | ICD-10-CM | POA: Diagnosis not present

## 2020-08-21 DIAGNOSIS — Z85828 Personal history of other malignant neoplasm of skin: Secondary | ICD-10-CM | POA: Diagnosis not present

## 2020-08-21 HISTORY — PX: KNEE ARTHROPLASTY: SHX992

## 2020-08-21 LAB — TYPE AND SCREEN
ABO/RH(D): O POS
Antibody Screen: NEGATIVE

## 2020-08-21 LAB — ABO/RH: ABO/RH(D): O POS

## 2020-08-21 SURGERY — ARTHROPLASTY, KNEE, TOTAL, USING IMAGELESS COMPUTER-ASSISTED NAVIGATION
Anesthesia: Regional | Site: Knee | Laterality: Right

## 2020-08-21 MED ORDER — MORPHINE SULFATE (PF) 4 MG/ML IV SOLN
0.5000 mg | INTRAVENOUS | Status: DC | PRN
  Administered 2020-08-21: 1 mg via INTRAVENOUS

## 2020-08-21 MED ORDER — POVIDONE-IODINE 10 % EX SWAB: 2.0000 "application " | Freq: Once | CUTANEOUS | Status: DC

## 2020-08-21 MED ORDER — ONDANSETRON HCL 4 MG/2ML IJ SOLN
4.0000 mg | Freq: Once | INTRAMUSCULAR | Status: AC | PRN
  Administered 2020-08-21: 4 mg via INTRAVENOUS

## 2020-08-21 MED ORDER — PHENYLEPHRINE HCL-NACL 10-0.9 MG/250ML-% IV SOLN
INTRAVENOUS | Status: DC | PRN
  Administered 2020-08-21: 25 ug/min via INTRAVENOUS

## 2020-08-21 MED ORDER — BUPIVACAINE-EPINEPHRINE (PF) 0.25% -1:200000 IJ SOLN
INTRAMUSCULAR | Status: AC
  Filled 2020-08-21: qty 30

## 2020-08-21 MED ORDER — MIDAZOLAM HCL 5 MG/5ML IJ SOLN
INTRAMUSCULAR | Status: DC | PRN
  Administered 2020-08-21: 1 mg via INTRAVENOUS
  Administered 2020-08-21: .5 mg via INTRAVENOUS

## 2020-08-21 MED ORDER — PHENYLEPHRINE HCL (PRESSORS) 10 MG/ML IV SOLN
INTRAVENOUS | Status: AC
  Filled 2020-08-21: qty 1

## 2020-08-21 MED ORDER — BUPIVACAINE IN DEXTROSE 0.75-8.25 % IT SOLN
INTRATHECAL | Status: DC | PRN
  Administered 2020-08-21: 1.8 mL via INTRATHECAL

## 2020-08-21 MED ORDER — FENTANYL CITRATE (PF) 100 MCG/2ML IJ SOLN
INTRAMUSCULAR | Status: AC
  Filled 2020-08-21: qty 2

## 2020-08-21 MED ORDER — PHENYLEPHRINE 40 MCG/ML (10ML) SYRINGE FOR IV PUSH (FOR BLOOD PRESSURE SUPPORT)
PREFILLED_SYRINGE | INTRAVENOUS | Status: AC
  Filled 2020-08-21: qty 10

## 2020-08-21 MED ORDER — POVIDONE-IODINE 10 % EX SWAB
2.0000 "application " | Freq: Once | CUTANEOUS | Status: AC
  Administered 2020-08-21: 2 via TOPICAL

## 2020-08-21 MED ORDER — KETOROLAC TROMETHAMINE 30 MG/ML IJ SOLN
INTRAMUSCULAR | Status: AC
  Filled 2020-08-21: qty 1

## 2020-08-21 MED ORDER — LACTATED RINGERS IV SOLN
INTRAVENOUS | Status: DC
  Administered 2020-08-21: 1000 mL via INTRAVENOUS

## 2020-08-21 MED ORDER — PROPOFOL 10 MG/ML IV BOLUS
INTRAVENOUS | Status: AC
  Filled 2020-08-21: qty 20

## 2020-08-21 MED ORDER — ACETAMINOPHEN 10 MG/ML IV SOLN
1000.0000 mg | Freq: Once | INTRAVENOUS | Status: AC
  Administered 2020-08-21: 1000 mg via INTRAVENOUS
  Filled 2020-08-21: qty 100

## 2020-08-21 MED ORDER — PROPOFOL 1000 MG/100ML IV EMUL
INTRAVENOUS | Status: AC
  Filled 2020-08-21: qty 100

## 2020-08-21 MED ORDER — HYDROCODONE-ACETAMINOPHEN 7.5-325 MG PO TABS: 1.0000 | ORAL_TABLET | ORAL | Status: DC | PRN

## 2020-08-21 MED ORDER — KETOROLAC TROMETHAMINE 15 MG/ML IJ SOLN
INTRAMUSCULAR | Status: AC
  Filled 2020-08-21: qty 1

## 2020-08-21 MED ORDER — BUPIVACAINE HCL (PF) 0.5 % IJ SOLN: INTRAMUSCULAR | Status: DC | PRN

## 2020-08-21 MED ORDER — LACTATED RINGERS IV BOLUS: 250.0000 mL | Freq: Once | INTRAVENOUS | Status: DC

## 2020-08-21 MED ORDER — KETOROLAC TROMETHAMINE 15 MG/ML IJ SOLN
7.5000 mg | Freq: Four times a day (QID) | INTRAMUSCULAR | Status: DC
  Administered 2020-08-21: 7.5 mg via INTRAVENOUS

## 2020-08-21 MED ORDER — LACTATED RINGERS IV SOLN: INTRAVENOUS | Status: DC

## 2020-08-21 MED ORDER — TRANEXAMIC ACID-NACL 1000-0.7 MG/100ML-% IV SOLN
1000.0000 mg | INTRAVENOUS | Status: AC
  Administered 2020-08-21: 1000 mg via INTRAVENOUS
  Filled 2020-08-21: qty 100

## 2020-08-21 MED ORDER — ONDANSETRON HCL 4 MG/2ML IJ SOLN
INTRAMUSCULAR | Status: AC
  Filled 2020-08-21: qty 2

## 2020-08-21 MED ORDER — CEFAZOLIN SODIUM-DEXTROSE 2-4 GM/100ML-% IV SOLN
2.0000 g | INTRAVENOUS | Status: AC
  Administered 2020-08-21: 2 g via INTRAVENOUS
  Filled 2020-08-21: qty 100

## 2020-08-21 MED ORDER — ONDANSETRON HCL 4 MG PO TABS: 4.0000 mg | ORAL_TABLET | Freq: Three times a day (TID) | ORAL | 0 refills | Status: AC | PRN

## 2020-08-21 MED ORDER — PROPOFOL 500 MG/50ML IV EMUL
INTRAVENOUS | Status: DC | PRN
  Administered 2020-08-21: 100 ug/kg/min via INTRAVENOUS

## 2020-08-21 MED ORDER — ORAL CARE MOUTH RINSE: 15.0000 mL | Freq: Once | OROMUCOSAL | Status: AC

## 2020-08-21 MED ORDER — FENTANYL CITRATE (PF) 100 MCG/2ML IJ SOLN
INTRAMUSCULAR | Status: DC | PRN
Start: 2020-08-21 — End: 2020-08-21
  Administered 2020-08-21 (×4): 25 ug via INTRAVENOUS
  Administered 2020-08-21 (×2): 50 ug via INTRAVENOUS

## 2020-08-21 MED ORDER — DOCUSATE SODIUM 100 MG PO CAPS: 100.0000 mg | ORAL_CAPSULE | Freq: Two times a day (BID) | ORAL | 1 refills | Status: AC

## 2020-08-21 MED ORDER — LACTATED RINGERS IV BOLUS
500.0000 mL | Freq: Once | INTRAVENOUS | Status: AC
  Administered 2020-08-21: 500 mL via INTRAVENOUS

## 2020-08-21 MED ORDER — HYDROCODONE-ACETAMINOPHEN 5-325 MG PO TABS: 1.0000 | ORAL_TABLET | ORAL | Status: DC | PRN

## 2020-08-21 MED ORDER — MIDAZOLAM HCL 2 MG/2ML IJ SOLN
INTRAMUSCULAR | Status: AC
  Filled 2020-08-21: qty 2

## 2020-08-21 MED ORDER — ISOPROPYL ALCOHOL 70 % SOLN
Status: AC
  Filled 2020-08-21: qty 480

## 2020-08-21 MED ORDER — SODIUM CHLORIDE 0.9 % IV SOLN: INTRAVENOUS | Status: DC

## 2020-08-21 MED ORDER — FENTANYL CITRATE (PF) 100 MCG/2ML IJ SOLN
25.0000 ug | INTRAMUSCULAR | Status: DC | PRN
  Administered 2020-08-21: 50 ug via INTRAVENOUS

## 2020-08-21 MED ORDER — SENNA 8.6 MG PO TABS: 2.0000 | ORAL_TABLET | Freq: Every day | ORAL | 1 refills | Status: AC

## 2020-08-21 MED ORDER — STERILE WATER FOR IRRIGATION IR SOLN
Status: DC | PRN
  Administered 2020-08-21: 2000 mL

## 2020-08-21 MED ORDER — CEFAZOLIN SODIUM-DEXTROSE 2-4 GM/100ML-% IV SOLN
2.0000 g | Freq: Four times a day (QID) | INTRAVENOUS | Status: DC
  Administered 2020-08-21: 2 g via INTRAVENOUS

## 2020-08-21 MED ORDER — CHLORHEXIDINE GLUCONATE 0.12 % MT SOLN
15.0000 mL | Freq: Once | OROMUCOSAL | Status: AC
  Administered 2020-08-21: 15 mL via OROMUCOSAL

## 2020-08-21 MED ORDER — CEFAZOLIN SODIUM-DEXTROSE 2-4 GM/100ML-% IV SOLN
INTRAVENOUS | Status: AC
  Filled 2020-08-21: qty 100

## 2020-08-21 MED ORDER — DEXAMETHASONE SODIUM PHOSPHATE 10 MG/ML IJ SOLN
INTRAMUSCULAR | Status: AC
  Filled 2020-08-21: qty 1

## 2020-08-21 MED ORDER — PHENYLEPHRINE HCL (PRESSORS) 10 MG/ML IV SOLN
INTRAVENOUS | Status: DC | PRN
  Administered 2020-08-21: 80 ug via INTRAVENOUS

## 2020-08-21 MED ORDER — ISOPROPYL ALCOHOL 70 % SOLN
Status: DC | PRN
  Administered 2020-08-21: 1 via TOPICAL

## 2020-08-21 MED ORDER — PROPOFOL 10 MG/ML IV BOLUS
INTRAVENOUS | Status: DC | PRN
  Administered 2020-08-21: 20 mg via INTRAVENOUS
  Administered 2020-08-21: 15 mg via INTRAVENOUS
  Administered 2020-08-21 (×2): 20 mg via INTRAVENOUS
  Administered 2020-08-21: 15 mg via INTRAVENOUS

## 2020-08-21 MED ORDER — KETOROLAC TROMETHAMINE 30 MG/ML IJ SOLN
INTRAMUSCULAR | Status: DC | PRN
  Administered 2020-08-21: 30 mg via INTRAVENOUS

## 2020-08-21 MED ORDER — SODIUM CHLORIDE (PF) 0.9 % IJ SOLN
INTRAMUSCULAR | Status: AC
  Filled 2020-08-21: qty 50

## 2020-08-21 MED ORDER — BUPIVACAINE-EPINEPHRINE 0.25% -1:200000 IJ SOLN
INTRAMUSCULAR | Status: DC | PRN
  Administered 2020-08-21: 30 mL

## 2020-08-21 MED ORDER — BUPIVACAINE-EPINEPHRINE (PF) 0.5% -1:200000 IJ SOLN
INTRAMUSCULAR | Status: DC | PRN
  Administered 2020-08-21: 20 mL

## 2020-08-21 MED ORDER — HYDROCODONE-ACETAMINOPHEN 5-325 MG PO TABS
1.0000 | ORAL_TABLET | ORAL | 0 refills | Status: AC | PRN
Start: 2020-08-21 — End: ?

## 2020-08-21 MED ORDER — ASPIRIN 81 MG PO CHEW: 81.0000 mg | CHEWABLE_TABLET | Freq: Two times a day (BID) | ORAL | 0 refills | Status: AC

## 2020-08-21 MED ORDER — SODIUM CHLORIDE 0.9 % IR SOLN
Status: DC | PRN
  Administered 2020-08-21: 3000 mL

## 2020-08-21 MED ORDER — SODIUM CHLORIDE (PF) 0.9 % IJ SOLN
INTRAMUSCULAR | Status: DC | PRN
  Administered 2020-08-21: 30 mL via INTRAVENOUS

## 2020-08-21 MED ORDER — MORPHINE SULFATE (PF) 4 MG/ML IV SOLN
INTRAVENOUS | Status: DC
Start: 2020-08-21 — End: 2020-08-22
  Filled 2020-08-21: qty 1

## 2020-08-21 SURGICAL SUPPLY — 76 items
ADH SKN CLS APL DERMABOND .7 (GAUZE/BANDAGES/DRESSINGS) ×2
APL PRP STRL LF DISP 70% ISPRP (MISCELLANEOUS) ×2
BAG SPEC THK2 15X12 ZIP CLS (MISCELLANEOUS)
BAG ZIPLOCK 12X15 (MISCELLANEOUS) IMPLANT
BATTERY INSTRU NAVIGATION (MISCELLANEOUS) ×9 IMPLANT
BLADE SAW RECIPROCATING 77.5 (BLADE) ×3 IMPLANT
BNDG ELASTIC 4X5.8 VLCR STR LF (GAUZE/BANDAGES/DRESSINGS) ×3 IMPLANT
BNDG ELASTIC 6X5.8 VLCR STR LF (GAUZE/BANDAGES/DRESSINGS) ×3 IMPLANT
BSPLAT TIB 3 KN TRITANIUM (Knees) ×1 IMPLANT
BTRY SRG DRVR LF (MISCELLANEOUS) ×3
CHLORAPREP W/TINT 26 (MISCELLANEOUS) ×6 IMPLANT
COMPONENT TRI CR RETAIN KNEE (Orthopedic Implant) IMPLANT
COVER SURGICAL LIGHT HANDLE (MISCELLANEOUS) ×3 IMPLANT
COVER WAND RF STERILE (DRAPES) IMPLANT
CUFF TOURN SGL QUICK 34 (TOURNIQUET CUFF) ×3
CUFF TRNQT CYL 34X4.125X (TOURNIQUET CUFF) ×1 IMPLANT
DECANTER SPIKE VIAL GLASS SM (MISCELLANEOUS) ×4 IMPLANT
DERMABOND ADVANCED (GAUZE/BANDAGES/DRESSINGS) ×4
DERMABOND ADVANCED .7 DNX12 (GAUZE/BANDAGES/DRESSINGS) ×2 IMPLANT
DRAPE SHEET LG 3/4 BI-LAMINATE (DRAPES) ×9 IMPLANT
DRAPE U-SHAPE 47X51 STRL (DRAPES) ×3 IMPLANT
DRSG AQUACEL AG ADV 3.5X10 (GAUZE/BANDAGES/DRESSINGS) ×3 IMPLANT
DRSG TEGADERM 4X4.75 (GAUZE/BANDAGES/DRESSINGS) IMPLANT
ELECT BLADE TIP CTD 4 INCH (ELECTRODE) ×3 IMPLANT
ELECT REM PT RETURN 15FT ADLT (MISCELLANEOUS) ×3 IMPLANT
EVACUATOR 1/8 PVC DRAIN (DRAIN) IMPLANT
GAUZE SPONGE 4X4 12PLY STRL (GAUZE/BANDAGES/DRESSINGS) ×3 IMPLANT
GLOVE BIO SURGEON STRL SZ8.5 (GLOVE) ×6 IMPLANT
GLOVE BIOGEL M STRL SZ7.5 (GLOVE) ×9 IMPLANT
GLOVE BIOGEL PI IND STRL 8 (GLOVE) ×2 IMPLANT
GLOVE BIOGEL PI IND STRL 8.5 (GLOVE) ×1 IMPLANT
GLOVE BIOGEL PI INDICATOR 8 (GLOVE) ×4
GLOVE BIOGEL PI INDICATOR 8.5 (GLOVE) ×2
GOWN SPEC L3 XXLG W/TWL (GOWN DISPOSABLE) ×3 IMPLANT
GOWN SPEC L4 XLG W/TWL (GOWN DISPOSABLE) ×3 IMPLANT
HANDPIECE INTERPULSE COAX TIP (DISPOSABLE) ×3
HOLDER FOLEY CATH W/STRAP (MISCELLANEOUS) ×3 IMPLANT
HOOD PEEL AWAY FLYTE STAYCOOL (MISCELLANEOUS) ×9 IMPLANT
INSERT TIB CS TRIATH X3 9 (Insert) ×2 IMPLANT
JET LAVAGE IRRISEPT WOUND (IRRIGATION / IRRIGATOR) ×3
KIT TURNOVER KIT A (KITS) IMPLANT
KNEE PATELLA ASYMMETRIC 10X32 (Knees) ×2 IMPLANT
KNEE TIBIAL COMPONENT SZ3 (Knees) ×2 IMPLANT
LAVAGE JET IRRISEPT WOUND (IRRIGATION / IRRIGATOR) ×1 IMPLANT
MARKER SKIN DUAL TIP RULER LAB (MISCELLANEOUS) ×3 IMPLANT
NDL SAFETY ECLIPSE 18X1.5 (NEEDLE) ×1 IMPLANT
NEEDLE HYPO 18GX1.5 SHARP (NEEDLE) ×3
NEEDLE SPNL 18GX3.5 QUINCKE PK (NEEDLE) ×3 IMPLANT
NS IRRIG 1000ML POUR BTL (IV SOLUTION) ×3 IMPLANT
PACK TOTAL KNEE CUSTOM (KITS) ×3 IMPLANT
PADDING CAST COTTON 6X4 STRL (CAST SUPPLIES) ×3 IMPLANT
PENCIL SMOKE EVACUATOR (MISCELLANEOUS) IMPLANT
PIN FLUTED HEDLESS FIX 3.5X1/8 (PIN) ×3 IMPLANT
PROTECTOR NERVE ULNAR (MISCELLANEOUS) ×3 IMPLANT
SAW OSC TIP CART 19.5X105X1.3 (SAW) ×3 IMPLANT
SEALER BIPOLAR AQUA 6.0 (INSTRUMENTS) ×3 IMPLANT
SET HNDPC FAN SPRY TIP SCT (DISPOSABLE) ×1 IMPLANT
SET PAD KNEE POSITIONER (MISCELLANEOUS) ×3 IMPLANT
SPONGE DRAIN TRACH 4X4 STRL 2S (GAUZE/BANDAGES/DRESSINGS) IMPLANT
STAPLER VISISTAT 35W (STAPLE) ×2 IMPLANT
SUT MNCRL AB 3-0 PS2 18 (SUTURE) ×3 IMPLANT
SUT MNCRL AB 4-0 PS2 18 (SUTURE) ×3 IMPLANT
SUT MON AB 2-0 CT1 36 (SUTURE) ×3 IMPLANT
SUT STRATAFIX PDO 1 14 VIOLET (SUTURE) ×3
SUT STRATFX PDO 1 14 VIOLET (SUTURE) ×1
SUT VIC AB 1 CTX 36 (SUTURE) ×6
SUT VIC AB 1 CTX36XBRD ANBCTR (SUTURE) ×2 IMPLANT
SUT VIC AB 2-0 CT1 27 (SUTURE) ×6
SUT VIC AB 2-0 CT1 TAPERPNT 27 (SUTURE) ×1 IMPLANT
SUTURE STRATFX PDO 1 14 VIOLET (SUTURE) ×1 IMPLANT
SYR 3ML LL SCALE MARK (SYRINGE) ×3 IMPLANT
TOWER CARTRIDGE SMART MIX (DISPOSABLE) IMPLANT
TRAY FOLEY MTR SLVR 16FR STAT (SET/KITS/TRAYS/PACK) IMPLANT
TRIA CRUCIATE RETAIN KNEE (Orthopedic Implant) ×3 IMPLANT
TUBE SUCTION HIGH CAP CLEAR NV (SUCTIONS) ×3 IMPLANT
WATER STERILE IRR 1000ML POUR (IV SOLUTION) ×6 IMPLANT

## 2020-08-21 NOTE — Op Note (Signed)
OPERATIVE REPORT  SURGEON: Rod Can, MD   ASSISTANT: Cherlynn June, PA-C  PREOPERATIVE DIAGNOSIS: Right knee arthritis.   POSTOPERATIVE DIAGNOSIS: Right knee arthritis.   PROCEDURE: Right total knee arthroplasty.   IMPLANTS: Stryker Triathlon CR femur, size 3. Stryker Tritanium tibia, size 3. X3 polyethelyene insert, size 9 mm, CS. 3 button asymmetric patella, size 32 mm.  ANESTHESIA:  MAC, Regional and Spinal  TOURNIQUET TIME: Not utilized.   ESTIMATED BLOOD LOSS:-250 mL    ANTIBIOTICS: 2g Ancef.  DRAINS: None.  COMPLICATIONS: None   CONDITION: PACU - hemodynamically stable.   BRIEF CLINICAL NOTE: Molly Cameron is a 74 y.o. female with a long-standing history of Right knee arthritis. After failing conservative management, the patient was indicated for total knee arthroplasty. The risks, benefits, and alternatives to the procedure were explained, and the patient elected to proceed.  PROCEDURE IN DETAIL: Adductor canal block was obtained in the pre-op holding area. Once inside the operative room, spinal anesthesia was obtained, and a foley catheter was inserted. The patient was then positioned, a nonsterile tourniquet was placed, and the lower extremity was prepped and draped in the normal sterile surgical fashion.  A time-out was called verifying side and site of surgery. The patient received IV antibiotics within 60 minutes of beginning the procedure. The tourniquet was not utilized.   An anterior approach to the knee was performed utilizing a midvastus arthrotomy. A medial release was performed and the patellar fat pad was excised. Stryker navigation was used to cut the distal femur perpendicular to the mechanical axis. A freehand patellar resection was performed, and the patella was sized an prepared with 3 lug holes.  Nagivation was used to make a neutral proximal tibia resection, taking 6 mm of bone from the less affected lateral side with 3 degrees of slope. The  menisci were excised. A spacer block was placed, and the alignment and balance in extension were confirmed.   The distal femur was sized using the 3-degree external rotation guide referencing the posterior femoral cortex. The appropriate 4-in-1 cutting block was pinned into place. Rotation was checked using Whiteside's line, the epicondylar axis, and then confirmed with a spacer block in flexion. The remaining femoral cuts were performed, taking care to protect the MCL.  The tibia was sized and the trial tray was pinned into place. The remaining trail components were inserted. The knee was stable to varus and valgus stress through a full range of motion. The patella tracked centrally, and the PCL was well balanced. The trial components were removed, and the proximal tibial surface was prepared. Final components were impacted into place. The knee was tested for a final time and found to be well balanced.   The wound was copiously irrigated with Irrisept solution and normal saline using pule lavage.  Marcaine solution was injected into the periarticular soft tissue.  The wound was closed in layers using #1 Vicryl and Stratafix for the fascia, 2-0 Vicryl for the subcutaneous fat, 2-0 Monocryl for the deep dermal layer, and staples for the skin. Dermabond was applied to the skin.  Once the glue was fully dried, an Aquacell Ag and compressive dressing were applied.  Tthe patient was transported to the recovery room in stable condition.  Sponge, needle, and instrument counts were correct at the end of the case x2.  The patient tolerated the procedure well and there were no known complications.  Please note that a surgical assistant was a medical necessity for this procedure in order  to perform it in a safe and expeditious manner. Surgical assistant was necessary to retract the ligaments and vital neurovascular structures to prevent injury to them and also necessary for proper positioning of the limb to allow for  anatomic placement of the prosthesis.

## 2020-08-21 NOTE — Discharge Summary (Signed)
Physician Discharge Summary  Patient ID: Molly Cameron MRN: [id number] DOB/AGE: 1946/08/02 74 y.o.  Admit date: 08/21/2020 Discharge date: 08/21/2020  Admission Diagnoses:  Osteoarthritis of right knee  Discharge Diagnoses:  Principal Problem:   Osteoarthritis of right knee   Past Medical History:  Diagnosis Date  . Arthritis   . Cancer (HCC)    skin bil. face,arms  . Complication of anesthesia   . Hypertension   . Hypothyroidism   . Pneumonia   . PONV (postoperative nausea and vomiting)   . Pre-diabetes     Surgeries: Procedure(s): COMPUTER ASSISTED TOTAL KNEE ARTHROPLASTY on 08/21/2020   Consultants (if any):   Discharged Condition: Improved  Hospital Course: Molly Cameron is an 74 y.o. female who was admitted 08/21/2020 with a diagnosis of Osteoarthritis of right knee and went to the operating room on 08/21/2020 and underwent the above named procedures.    She was given perioperative antibiotics:  Anti-infectives (From admission, onward)   Start     Dose/Rate Route Frequency Ordered Stop   08/21/20 0600  ceFAZolin (ANCEF) IVPB 2g/100 mL premix        2 g 200 mL/hr over 30 Minutes Intravenous On call to O.R. 08/21/20 8099 08/21/20 0730    .  She was given sequential compression devices, early ambulation, and ASA for DVT prophylaxis.  She benefited maximally from the hospital stay and there were no complications.    Recent vital signs:  Vitals:   08/21/20 0624 08/21/20 1023  BP: 134/74 119/65  Pulse: 67   Resp: (!) 22 16  Temp:  (!) 97.5 F (36.4 C)  SpO2: 99% 100%    Recent laboratory studies:  Lab Results  Component Value Date   HGB 13.0 08/18/2020   Lab Results  Component Value Date   WBC 6.6 08/18/2020   PLT 212 08/18/2020   Lab Results  Component Value Date   INR 1.0 08/18/2020   Lab Results  Component Value Date   NA 142 08/18/2020   K 4.1 08/18/2020   CL 107 08/18/2020   CO2 22 08/18/2020   BUN 32 (H) 08/18/2020   CREATININE 1.21  (H) 08/18/2020   GLUCOSE 105 (H) 08/18/2020    Discharge Medications:   Allergies as of 08/21/2020      Reactions   Codeine Itching   Quinaminoph [quinine]    Causes migraines      Medication List    TAKE these medications   allopurinol 100 MG tablet Commonly known as: ZYLOPRIM Take 100 mg by mouth daily.   amLODipine-benazepril 5-40 MG capsule Commonly known as: LOTREL Take 1 capsule by mouth daily.   aspirin 81 MG chewable tablet Commonly known as: Aspirin Childrens Chew 1 tablet (81 mg total) by mouth 2 (two) times daily with a meal.   atorvastatin 10 MG tablet Commonly known as: LIPITOR Take 10 mg by mouth daily.   B-12 2500 MCG Tabs Take 2,500 mcg by mouth daily.   Biotin 5000 5 MG Caps Generic drug: Biotin Take 5 mg by mouth daily.   CALCIUM 1200+D3 PO Take 1 tablet by mouth daily.   cetirizine 10 MG tablet Commonly known as: ZYRTEC Take 10 mg by mouth daily.   docusate sodium 100 MG capsule Commonly known as: Colace Take 1 capsule (100 mg total) by mouth 2 (two) times daily.   hydrochlorothiazide 12.5 MG capsule Commonly known as: MICROZIDE Take 12.5 mg by mouth daily.   HYDROcodone-acetaminophen 5-325 MG tablet Commonly known as: Norco  Take 1 tablet by mouth every 4 (four) hours as needed for moderate pain.   metFORMIN 500 MG 24 hr tablet Commonly known as: GLUCOPHAGE-XR Take 500 mg by mouth daily with breakfast.   multivitamin with minerals Tabs tablet Take 1 tablet by mouth daily.   nabumetone 750 MG tablet Commonly known as: RELAFEN Take 750 mg by mouth 2 (two) times daily.   ondansetron 4 MG tablet Commonly known as: Zofran Take 1 tablet (4 mg total) by mouth every 8 (eight) hours as needed for nausea or vomiting.   Osteo Bi-Flex One Per Day Tabs Take 1 tablet by mouth daily.   oyster calcium 500 MG Tabs tablet Take 500 mg of elemental calcium by mouth daily.   senna 8.6 MG Tabs tablet Commonly known as: SENOKOT Take 2 tablets  (17.2 mg total) by mouth at bedtime.   Synthroid 25 MCG tablet Generic drug: levothyroxine Take 25 mcg by mouth daily before breakfast.   zinc gluconate 50 MG tablet Take 50 mg by mouth daily.       Diagnostic Studies: No results found.  Disposition: Discharge disposition: 01-Home or Self Care       Discharge Instructions    Call MD / Call 911   Complete by: As directed    If you experience chest pain or shortness of breath, CALL 911 and be transported to the hospital emergency room.  If you develope a fever above 101 F, pus (white drainage) or increased drainage or redness at the wound, or calf pain, call your surgeon's office.   Constipation Prevention   Complete by: As directed    Drink plenty of fluids.  Prune juice may be helpful.  You may use a stool softener, such as Colace (over the counter) 100 mg twice a day.  Use MiraLax (over the counter) for constipation as needed.   Diet - low sodium heart healthy   Complete by: As directed    Do not put a pillow under the knee. Place it under the heel.   Complete by: As directed    Driving restrictions   Complete by: As directed    No driving for 6 weeks   Increase activity slowly as tolerated   Complete by: As directed    Lifting restrictions   Complete by: As directed    No lifting for 6 weeks   TED hose   Complete by: As directed    Use stockings (TED hose) for 2 weeks on both leg(s).  You may remove them at night for sleeping.       Follow-up Information    Charlesia Canaday, Aaron Edelman, MD. Schedule an appointment as soon as possible for a visit in 2 weeks.   Specialty: Orthopedic Surgery Why: For wound re-check Contact information: [address] [place] [address] [phone]                Signed: Hilton Cork Taneika Choi 08/21/2020, 10:29 AM

## 2020-08-21 NOTE — Anesthesia Procedure Notes (Signed)
Anesthesia Regional Block: Adductor canal block   Pre-Anesthetic Checklist: ,, timeout performed, Correct Patient, Correct Site, Correct Laterality, Correct Procedure, Correct Position, site marked, Risks and benefits discussed,  Surgical consent,  Pre-op evaluation,  At surgeon's request and post-op pain management  Laterality: Right  Prep: chloraprep       Needles:  Injection technique: Single-shot  Needle Type: Stimiplex     Needle Length: 9cm  Needle Gauge: 21     Additional Needles:   Procedures:,,,, ultrasound used (permanent image in chart),,,,  Narrative:  Start time: 08/21/2020 6:50 AM End time: 08/21/2020 6:55 AM Injection made incrementally with aspirations every 5 mL.  Performed by: Personally  Anesthesiologist: Merlinda Frederick, MD

## 2020-08-21 NOTE — Interval H&P Note (Signed)
History and Physical Interval Note:  08/21/2020 7:25 AM  Molly Cameron  has presented today for surgery, with the diagnosis of degenerative joint disease right knee.  The various methods of treatment have been discussed with the patient and family. After consideration of risks, benefits and other options for treatment, the patient has consented to  Procedure(s): COMPUTER ASSISTED TOTAL KNEE ARTHROPLASTY (Right) as a surgical intervention.  The patient's history has been reviewed, patient examined, no change in status, stable for surgery.  I have reviewed the patient's chart and labs.  Questions were answered to the patient's satisfaction.    The risks, benefits, and alternatives were discussed with the patient. There are risks associated with the surgery including, but not limited to, problems with anesthesia (death), infection, instability (giving out of the joint), dislocation, differences in leg length/angulation/rotation, fracture of bones, loosening or failure of implants, hematoma (blood accumulation) which may require surgical drainage, blood clots, pulmonary embolism, nerve injury (foot drop and lateral thigh numbness), and blood vessel injury. The patient understands these risks and elects to proceed.    Hilton Cork Lin Glazier

## 2020-08-21 NOTE — Discharge Instructions (Signed)
° °Dr. Lennard Capek °Total Joint Specialist °[facility] °[address] °[place], [place] [address] °[phone] ° °TOTAL KNEE REPLACEMENT POSTOPERATIVE DIRECTIONS ° ° ° °Knee Rehabilitation, Guidelines Following Surgery  °Results after knee surgery are often greatly improved when you follow the exercise, range of motion and muscle strengthening exercises prescribed by your doctor. Safety measures are also important to protect the knee from further injury. Any time any of these exercises cause you to have increased pain or swelling in your knee joint, decrease the amount until you are comfortable again and slowly increase them. If you have problems or questions, call your caregiver or physical therapist for advice.  ° °WEIGHT BEARING °Weight bearing as tolerated with assist device (walker, cane, etc) as directed, use it as long as suggested by your surgeon or therapist, typically at least 4-6 weeks. ° °HOME CARE INSTRUCTIONS  °Remove items at home which could result in a fall. This includes throw rugs or furniture in walking pathways.  °Continue medications as instructed at time of discharge. °You may have some home medications which will be placed on hold until you complete the course of blood thinner medication.  °You may start showering once you are discharged home but do not submerge the incision under water. Just pat the incision dry and apply a dry gauze dressing on daily. °Walk with walker as instructed.  °You may resume a sexual relationship in one month or when given the OK by your doctor.  °· Use walker as long as suggested by your caregivers. °· Avoid periods of inactivity such as sitting longer than an hour when not asleep. This helps prevent blood clots.  °You may put full weight on your legs and walk as much as is comfortable.  °You may return to work once you are cleared by your doctor.  °Do not drive a car for 6 weeks or until released by you surgeon.  °· Do not drive  while taking narcotics.  °Wear the elastic stockings for three weeks following surgery during the day but you may remove then at night. °Make sure you keep all of your appointments after your operation with all of your doctors and caregivers. You should call the office at the above phone number and make an appointment for approximately two weeks after the date of your surgery. °Do not remove your surgical dressing. The dressing is waterproof; you may take showers in 3 days, but do not take tub baths or submerge the dressing. °Please pick up a stool softener and laxative for home use as long as you are requiring pain medications. °· ICE to the affected knee every three hours for 30 minutes at a time and then as needed for pain and swelling.  Continue to use ice on the knee for pain and swelling from surgery. You may notice swelling that will progress down to the foot and ankle.  This is normal after surgery.  Elevate the leg when you are not up walking on it.   °It is important for you to complete the blood thinner medication as prescribed by your doctor. °· Continue to use the breathing machine which will help keep your temperature down.  It is common for your temperature to cycle up and down following surgery, especially at night when you are not up moving around and exerting yourself.  The breathing machine keeps your lungs expanded and your temperature down. ° °RANGE OF MOTION AND STRENGTHENING EXERCISES  °Rehabilitation of the knee is important following   a knee injury or an operation. After just a few days of immobilization, the muscles of the thigh which control the knee become weakened and shrink (atrophy). Knee exercises are designed to build up the tone and strength of the thigh muscles and to improve knee motion. Often times heat used for twenty to thirty minutes before working out will loosen up your tissues and help with improving the range of motion but do not use heat for the first two weeks following  surgery. These exercises can be done on a training (exercise) mat, on the floor, on a table or on a bed. Use what ever works the best and is most comfortable for you Knee exercises include:  °Leg Lifts - While your knee is still immobilized in a splint or cast, you can do straight leg raises. Lift the leg to 60 degrees, hold for 3 sec, and slowly lower the leg. Repeat 10-20 times 2-3 times daily. Perform this exercise against resistance later as your knee gets better.  °Quad and Hamstring Sets - Tighten up the muscle on the front of the thigh (Quad) and hold for 5-10 sec. Repeat this 10-20 times hourly. Hamstring sets are done by pushing the foot backward against an object and holding for 5-10 sec. Repeat as with quad sets.  °A rehabilitation program following serious knee injuries can speed recovery and prevent re-injury in the future due to weakened muscles. Contact your doctor or a physical therapist for more information on knee rehabilitation.  ° °SKILLED REHAB INSTRUCTIONS: °If the patient is transferred to a skilled rehab facility following release from the hospital, a list of the current medications will be sent to the facility for the patient to continue.  When discharged from the skilled rehab facility, please have the facility set up the patient's Home Health Physical Therapy prior to being released. Also, the skilled facility will be responsible for providing the patient with their medications at time of release from the facility to include their pain medication, the muscle relaxants, and their blood thinner medication. If the patient is still at the rehab facility at time of the two week follow up appointment, the skilled rehab facility will also need to assist the patient in arranging follow up appointment in our office and any transportation needs. ° °MAKE SURE YOU:  °Understand these instructions.  °Will watch your condition.  °Will get help right away if you are not doing well or get worse.   ° ° °Pick up stool softner and laxative for home use following surgery while on pain medications. °Do NOT remove your dressing. You may shower.  °Do not take tub baths or submerge incision under water. °May shower starting three days after surgery. °Please use a clean towel to pat the incision dry following showers. °Continue to use ice for pain and swelling after surgery. °Do not use any lotions or creams on the incision until instructed by your surgeon. ° °

## 2020-08-21 NOTE — Evaluation (Signed)
Physical Therapy Evaluation Patient Details Name: Molly Cameron MRN: [id number] DOB: 1946/11/28 Today's Date: 08/21/2020   History of Present Illness  Patient is 74 y.o. female s/p Rt TKA on 08/21/20 with PMH significant for HTN, hypothyroidism, OA.  Clinical Impression  Molly Cameron is a 74 y.o. female POD 0 s/p Rt TKA. Patient reports independence with mobility at baseline. Patient is now limited by functional impairments (see PT problem list below) and requires supervision/min guard for transfers and gait with RW. Patient was able to ambulate ~110 feet with RW and min guard/supervision and cues for safe walker management. Patient educated on safe sequencing for stair mobility and verbalized safe guarding position for people assisting with mobility; pt has 1 step up through door and has been completing with RW and assist from daughter prior to surgery. Patient instructed in exercises to facilitate ROM and circulation. Patient will benefit from continued skilled PT interventions to address impairments and progress towards PLOF. Patient has met mobility goals at adequate level for discharge home; will continue to follow if pt continues acute stay to progress towards Mod I goals.     Follow Up Recommendations Follow surgeon's recommendation for DC plan and follow-up therapies;Outpatient PT    Equipment Recommendations  None recommended by PT    Recommendations for Other Services       Precautions / Restrictions Precautions Precautions: Fall Restrictions Weight Bearing Restrictions: No Other Position/Activity Restrictions: WBAT      Mobility  Bed Mobility Overal bed mobility: Needs Assistance Bed Mobility: Supine to Sit;Sit to Supine     Supine to sit: Supervision Sit to supine: Supervision;Min guard   General bed mobility comments: pt required extra time to sit up to EOB, no assist needed to raise trunk or lower legs off EOB,   Transfers Overall transfer level: Needs  assistance Equipment used: Rolling walker (2 wheeled) Transfers: Sit to/from Stand Sit to Stand: Supervision;Min guard         General transfer comment: cues for technique with RW, no assist required to rise. cues for safe reach back to sit EOB.   Ambulation/Gait Ambulation/Gait assistance: Min guard;Supervision Gait Distance (Feet): 110 Feet Assistive device: Rolling walker (2 wheeled) Gait Pattern/deviations: Step-to pattern;Decreased stride length;Decreased weight shift to right Gait velocity: decr   General Gait Details: VC's for safe step pattern, no overt LOB or Rt knee buckling. Pt's daughter present and educated on safe guarding and provided during gait.  Stairs Stairs:  (demonstration of single step negotiation)          Wheelchair Mobility    Modified Rankin (Stroke Patients Only)       Balance Overall balance assessment: Needs assistance Sitting-balance support: Feet supported Sitting balance-Leahy Scale: Good     Standing balance support: During functional activity;Bilateral upper extremity supported Standing balance-Leahy Scale: Fair            Pertinent Vitals/Pain Pain Assessment: 0-10 Pain Score: 3  Pain Location: Rt knee Pain Descriptors / Indicators: Aching;Discomfort Pain Intervention(s): Limited activity within patient's tolerance;Monitored during session;Premedicated before session;Repositioned    Home Living Family/patient expects to be discharged to:: Private residence Living Arrangements: Children Available Help at Discharge: Family Type of Home: House Home Access: Ramped entrance     Home Layout: One level Home Equipment: Environmental consultant - 2 wheels;Cane - single point;Cane - quad;Shower seat;Bedside commode Additional Comments: son and daughter with mom, son will have her during day and help her appts.    Prior Function Level of Independence:  Independent with assistive device(s)         Comments: using RW for baout 2 months, was  using quad cane prior to that.      Hand Dominance   Dominant Hand: Right    Extremity/Trunk Assessment   Upper Extremity Assessment Upper Extremity Assessment: Overall WFL for tasks assessed    Lower Extremity Assessment Lower Extremity Assessment: Overall WFL for tasks assessed;RLE deficits/detail RLE Deficits / Details: good quad activation, no extensor lag with SLR RLE Sensation: WNL RLE Coordination: WNL    Cervical / Trunk Assessment Cervical / Trunk Assessment: Normal  Communication   Communication: No difficulties  Cognition Arousal/Alertness: Awake/alert Behavior During Therapy: WFL for tasks assessed/performed Overall Cognitive Status: Within Functional Limits for tasks assessed               General Comments      Exercises Total Joint Exercises Ankle Circles/Pumps: AROM;Both;10 reps;Supine Quad Sets: AROM;Right;Other reps (comment);Supine (2) Short Arc Quad: AROM;Right;Other reps (comment);Supine (2) Heel Slides: AROM;Right;Other reps (comment);Supine (2) Hip ABduction/ADduction: AROM;Right;Other reps (comment);Supine (2) Straight Leg Raises: AROM;Right;Other reps (comment);Supine (2)   Assessment/Plan    PT Assessment Patient needs continued PT services  PT Problem List Decreased strength;Decreased range of motion;Decreased activity tolerance;Decreased balance;Decreased mobility;Decreased knowledge of use of DME;Decreased knowledge of precautions       PT Treatment Interventions Gait training;DME instruction;Stair training;Functional mobility training;Therapeutic activities;Therapeutic exercise;Balance training;Patient/family education    PT Goals (Current goals can be found in the Care Plan section)  Acute Rehab PT Goals Patient Stated Goal: get home and independent PT Goal Formulation: With patient Time For Goal Achievement: 08/28/20 Potential to Achieve Goals: Good    Frequency 7X/week   Barriers to discharge           AM-PAC PT "6  Clicks" Mobility  Outcome Measure Help needed turning from your back to your side while in a flat bed without using bedrails?: None Help needed moving from lying on your back to sitting on the side of a flat bed without using bedrails?: None Help needed moving to and from a bed to a chair (including a wheelchair)?: A Little Help needed standing up from a chair using your arms (e.g., wheelchair or bedside chair)?: A Little Help needed to walk in hospital room?: A Little Help needed climbing 3-5 steps with a railing? : A Little 6 Click Score: 20    End of Session Equipment Utilized During Treatment: Gait belt Activity Tolerance: Patient tolerated treatment well Patient left: in bed;with call bell/phone within reach;with family/visitor present Nurse Communication: Mobility status PT Visit Diagnosis: Muscle weakness (generalized) (M62.81);Difficulty in walking, not elsewhere classified (R26.2)    Time: 0240-9735 PT Time Calculation (min) (ACUTE ONLY): 41 min   Charges:   PT Evaluation $PT Eval Low Complexity: 1 Low PT Treatments $Gait Training: 8-22 mins $Therapeutic Exercise: 8-22 mins        Verner Mould, DPT Acute Rehabilitation Services  Office [phone] Pager [phone]  08/21/2020 5:02 PM

## 2020-08-21 NOTE — Transfer of Care (Signed)
Immediate Anesthesia Transfer of Care Note  Patient: Molly Cameron  Procedure(s) Performed: COMPUTER ASSISTED TOTAL KNEE ARTHROPLASTY (Right Knee)  Patient Location: PACU  Anesthesia Type:Spinal  Level of Consciousness: awake, alert , oriented and patient cooperative  Airway & Oxygen Therapy: Patient Spontanous Breathing and Patient connected to face mask oxygen  Post-op Assessment: Report given to RN and Post -op Vital signs reviewed and stable  Post vital signs: stable  Last Vitals:  Vitals Value Taken Time  BP 119/65 08/21/20 1022  Temp    Pulse 68 08/21/20 1027  Resp 12 08/21/20 1027  SpO2 100 % 08/21/20 1027  Vitals shown include unvalidated device data.  Last Pain:  Vitals:   08/21/20 0552  TempSrc: Oral  PainSc:       Patients Stated Pain Goal: 3 (38/93/73 [id number])  Complications: No complications documented.

## 2020-08-21 NOTE — Anesthesia Procedure Notes (Signed)
Procedure Name: MAC Date/Time: 08/21/2020 7:27 AM Performed by: Lissa Morales, CRNA Pre-anesthesia Checklist: Patient identified, Emergency Drugs available, Suction available, Patient being monitored and Timeout performed Patient Re-evaluated:Patient Re-evaluated prior to induction Oxygen Delivery Method: Simple face mask Placement Confirmation: positive ETCO2

## 2020-08-21 NOTE — Anesthesia Postprocedure Evaluation (Signed)
Anesthesia Post Note  Patient: Molly Cameron  Procedure(s) Performed: COMPUTER ASSISTED TOTAL KNEE ARTHROPLASTY (Right Knee)     Patient location during evaluation: PACU Anesthesia Type: Regional Level of consciousness: oriented and awake and alert Pain management: pain level controlled Vital Signs Assessment: post-procedure vital signs reviewed and stable Respiratory status: spontaneous breathing and respiratory function stable Cardiovascular status: blood pressure returned to baseline and stable Postop Assessment: no headache, no backache and no apparent nausea or vomiting Anesthetic complications: no   No complications documented.  Last Vitals:  Vitals:   08/21/20 1115 08/21/20 1130  BP: 126/78 113/69  Pulse: 73 (!) 59  Resp: 13 10  Temp:    SpO2: 100% 100%    Last Pain:  Vitals:   08/21/20 1130  TempSrc:   PainSc: [place]

## 2020-08-22 ENCOUNTER — Encounter (HOSPITAL_COMMUNITY): Payer: Self-pay | Admitting: Orthopedic Surgery

## 2020-08-22 NOTE — Anesthesia Procedure Notes (Signed)
Spinal  Patient location during procedure: OR Start time: 08/21/2020 7:33 AM End time: 08/21/2020 7:41 AM Staffing Performed: anesthesiologist  Anesthesiologist: Merlinda Frederick, MD Preanesthetic Checklist Completed: patient identified, IV checked, site marked, risks and benefits discussed, surgical consent, monitors and equipment checked, pre-op evaluation and timeout performed Spinal Block Patient position: sitting Prep: DuraPrep Patient monitoring: heart rate, cardiac monitor, continuous pulse ox and blood pressure Approach: midline Location: L3-4 Injection technique: single-shot Needle Needle type: Quincke  Needle gauge: 22 G Needle length: 5 cm Assessment Sensory level: T4

## 2020-08-22 NOTE — Addendum Note (Signed)
Addendum  created 08/22/20 0942 by Merlinda Frederick, MD   Child order released for a procedure order, Clinical Note Signed, Intraprocedure Blocks edited

## 2022-04-26 DEATH — deceased
